# Patient Record
Sex: Female | Born: 1945 | Race: White | Hispanic: No | Marital: Married | Smoking: Never smoker
Health system: Southern US, Community
[De-identification: ages and names within clinical notes are randomized; demographics above are authoritative.]

## PROBLEM LIST (undated history)

## (undated) DIAGNOSIS — F419 Anxiety disorder, unspecified: Secondary | ICD-10-CM

## (undated) DIAGNOSIS — E039 Hypothyroidism, unspecified: Secondary | ICD-10-CM

## (undated) DIAGNOSIS — M8580 Other specified disorders of bone density and structure, unspecified site: Secondary | ICD-10-CM

## (undated) DIAGNOSIS — I10 Essential (primary) hypertension: Secondary | ICD-10-CM

## (undated) DIAGNOSIS — N189 Chronic kidney disease, unspecified: Secondary | ICD-10-CM

## (undated) HISTORY — PX: HAND SURGERY: SHX662

## (undated) HISTORY — DX: Anxiety disorder, unspecified: F41.9

## (undated) HISTORY — DX: Chronic kidney disease, unspecified: N18.9

## (undated) HISTORY — PX: CATARACT EXTRACTION: SUR2

## (undated) HISTORY — DX: Other specified disorders of bone density and structure, unspecified site: M85.80

## (undated) HISTORY — DX: Essential (primary) hypertension: I10

## (undated) HISTORY — PX: TONSILLECTOMY: SUR1361

---

## 1997-10-01 ENCOUNTER — Other Ambulatory Visit: Admission: RE | Admit: 1997-10-01 | Discharge: 1997-10-01 | Payer: Self-pay | Admitting: Obstetrics and Gynecology

## 1998-10-15 ENCOUNTER — Other Ambulatory Visit: Admission: RE | Admit: 1998-10-15 | Discharge: 1998-10-15 | Payer: Self-pay | Admitting: Obstetrics and Gynecology

## 1999-10-21 ENCOUNTER — Other Ambulatory Visit: Admission: RE | Admit: 1999-10-21 | Discharge: 1999-10-21 | Payer: Self-pay | Admitting: Obstetrics and Gynecology

## 2000-11-07 ENCOUNTER — Other Ambulatory Visit: Admission: RE | Admit: 2000-11-07 | Discharge: 2000-11-07 | Payer: Self-pay | Admitting: Obstetrics and Gynecology

## 2001-11-22 ENCOUNTER — Other Ambulatory Visit: Admission: RE | Admit: 2001-11-22 | Discharge: 2001-11-22 | Payer: Self-pay | Admitting: Obstetrics and Gynecology

## 2002-12-19 ENCOUNTER — Other Ambulatory Visit: Admission: RE | Admit: 2002-12-19 | Discharge: 2002-12-19 | Payer: Self-pay | Admitting: Obstetrics and Gynecology

## 2003-12-25 ENCOUNTER — Other Ambulatory Visit: Admission: RE | Admit: 2003-12-25 | Discharge: 2003-12-25 | Payer: Self-pay | Admitting: Obstetrics and Gynecology

## 2005-01-05 ENCOUNTER — Other Ambulatory Visit: Admission: RE | Admit: 2005-01-05 | Discharge: 2005-01-05 | Payer: Self-pay | Admitting: Obstetrics and Gynecology

## 2011-03-02 ENCOUNTER — Other Ambulatory Visit: Payer: Self-pay | Admitting: Obstetrics and Gynecology

## 2013-03-27 ENCOUNTER — Other Ambulatory Visit: Payer: Self-pay | Admitting: Obstetrics and Gynecology

## 2015-10-28 ENCOUNTER — Other Ambulatory Visit: Payer: Self-pay | Admitting: Obstetrics and Gynecology

## 2015-10-29 LAB — CYTOLOGY - PAP

## 2016-02-25 ENCOUNTER — Encounter: Payer: Self-pay | Admitting: Internal Medicine

## 2016-02-25 ENCOUNTER — Other Ambulatory Visit: Payer: Self-pay | Admitting: Internal Medicine

## 2016-02-25 DIAGNOSIS — R0602 Shortness of breath: Secondary | ICD-10-CM

## 2016-03-15 ENCOUNTER — Ambulatory Visit (INDEPENDENT_AMBULATORY_CARE_PROVIDER_SITE_OTHER): Payer: BC Managed Care – PPO

## 2016-03-15 DIAGNOSIS — R0602 Shortness of breath: Secondary | ICD-10-CM | POA: Diagnosis not present

## 2016-03-15 LAB — EXERCISE TOLERANCE TEST
CHL RATE OF PERCEIVED EXERTION: 15
CSEPEDS: 51 s
CSEPPHR: 150 {beats}/min
Estimated workload: 2.1 METS
Exercise duration (min): 1 min
MPHR: 148 {beats}/min
Percent HR: 101 %
Rest HR: 83 {beats}/min

## 2016-10-12 ENCOUNTER — Encounter: Payer: Self-pay | Admitting: Internal Medicine

## 2019-03-18 ENCOUNTER — Other Ambulatory Visit: Payer: Self-pay

## 2019-03-18 DIAGNOSIS — Z20822 Contact with and (suspected) exposure to covid-19: Secondary | ICD-10-CM

## 2019-03-19 LAB — NOVEL CORONAVIRUS, NAA: SARS-CoV-2, NAA: NOT DETECTED

## 2020-10-16 ENCOUNTER — Emergency Department (HOSPITAL_COMMUNITY)
Admission: EM | Admit: 2020-10-16 | Discharge: 2020-10-16 | Disposition: A | Discharge status: Home / Self Care | Payer: BC Managed Care – PPO | Attending: Emergency Medicine | Admitting: Emergency Medicine

## 2020-10-16 ENCOUNTER — Other Ambulatory Visit: Payer: Self-pay

## 2020-10-16 ENCOUNTER — Encounter (HOSPITAL_COMMUNITY): Payer: Self-pay | Admitting: *Deleted

## 2020-10-16 ENCOUNTER — Emergency Department (HOSPITAL_COMMUNITY): Payer: BC Managed Care – PPO

## 2020-10-16 DIAGNOSIS — I129 Hypertensive chronic kidney disease with stage 1 through stage 4 chronic kidney disease, or unspecified chronic kidney disease: Secondary | ICD-10-CM | POA: Diagnosis not present

## 2020-10-16 DIAGNOSIS — Z79899 Other long term (current) drug therapy: Secondary | ICD-10-CM | POA: Insufficient documentation

## 2020-10-16 DIAGNOSIS — J189 Pneumonia, unspecified organism: Secondary | ICD-10-CM | POA: Insufficient documentation

## 2020-10-16 DIAGNOSIS — F17200 Nicotine dependence, unspecified, uncomplicated: Secondary | ICD-10-CM | POA: Diagnosis not present

## 2020-10-16 DIAGNOSIS — R42 Dizziness and giddiness: Secondary | ICD-10-CM | POA: Diagnosis present

## 2020-10-16 DIAGNOSIS — N189 Chronic kidney disease, unspecified: Secondary | ICD-10-CM | POA: Insufficient documentation

## 2020-10-16 DIAGNOSIS — R55 Syncope and collapse: Secondary | ICD-10-CM | POA: Diagnosis not present

## 2020-10-16 LAB — COMPREHENSIVE METABOLIC PANEL
ALT: 23 U/L (ref 0–44)
AST: 20 U/L (ref 15–41)
Albumin: 4.2 g/dL (ref 3.5–5.0)
Alkaline Phosphatase: 55 U/L (ref 38–126)
Anion gap: 11 (ref 5–15)
BUN: 15 mg/dL (ref 8–23)
CO2: 24 mmol/L (ref 22–32)
Calcium: 9.1 mg/dL (ref 8.9–10.3)
Chloride: 95 mmol/L — ABNORMAL LOW (ref 98–111)
Creatinine, Ser: 0.94 mg/dL (ref 0.44–1.00)
GFR, Estimated: >60 mL/min (ref >60)
Glucose, Bld: 122 mg/dL — ABNORMAL HIGH (ref 70–99)
Potassium: 4.5 mmol/L (ref 3.5–5.1)
Sodium: 130 mmol/L — ABNORMAL LOW (ref 135–145)
Total Bilirubin: 0.2 mg/dL — ABNORMAL LOW (ref 0.3–1.2)
Total Protein: 7.2 g/dL (ref 6.5–8.1)

## 2020-10-16 LAB — URINALYSIS, ROUTINE W REFLEX MICROSCOPIC
Bilirubin Urine: NEGATIVE
Glucose, UA: NEGATIVE mg/dL
Hgb urine dipstick: NEGATIVE
Ketones, ur: 5 mg/dL — AB
Nitrite: NEGATIVE
Protein, ur: NEGATIVE mg/dL
Specific Gravity, Urine: 1.006 (ref 1.005–1.030)
pH: 6 (ref 5.0–8.0)

## 2020-10-16 LAB — CBC WITH DIFFERENTIAL/PLATELET
Abs Immature Granulocytes: 0.16 10*3/uL — ABNORMAL HIGH (ref 0.00–0.07)
Basophils Absolute: 0.1 10*3/uL (ref 0.0–0.1)
Basophils Relative: 0 %
Eosinophils Absolute: 0 10*3/uL (ref 0.0–0.5)
Eosinophils Relative: 0 %
HCT: 32.4 % — ABNORMAL LOW (ref 36.0–46.0)
Hemoglobin: 10.9 g/dL — ABNORMAL LOW (ref 12.0–15.0)
Immature Granulocytes: 1 %
Lymphocytes Relative: 17 %
Lymphs Abs: 2.4 10*3/uL (ref 0.7–4.0)
MCH: 31.8 pg (ref 26.0–34.0)
MCHC: 33.6 g/dL (ref 30.0–36.0)
MCV: 94.5 fL (ref 80.0–100.0)
Monocytes Absolute: 1.4 10*3/uL — ABNORMAL HIGH (ref 0.1–1.0)
Monocytes Relative: 10 %
Neutro Abs: 10.3 10*3/uL — ABNORMAL HIGH (ref 1.7–7.7)
Neutrophils Relative %: 72 %
Platelets: 253 10*3/uL (ref 150–400)
RBC: 3.43 MIL/uL — ABNORMAL LOW (ref 3.87–5.11)
RDW: 12.1 % (ref 11.5–15.5)
WBC: 14.4 10*3/uL — ABNORMAL HIGH (ref 4.0–10.5)
nRBC: 0 % (ref 0.0–0.2)

## 2020-10-16 LAB — LIPASE, BLOOD: Lipase: 29 U/L (ref 11–51)

## 2020-10-16 MED ORDER — SODIUM CHLORIDE 0.9 % IV BOLUS
500.0000 mL | Freq: Once | INTRAVENOUS | Status: AC
  Administered 2020-10-16: 500 mL via INTRAVENOUS

## 2020-10-16 MED ORDER — OXYCODONE HCL 5 MG PO TABS: 10.0000 mg | ORAL_TABLET | Freq: Once | ORAL | Status: DC

## 2020-10-16 MED ORDER — DOXYCYCLINE HYCLATE 100 MG PO CAPS: 100.0000 mg | ORAL_CAPSULE | Freq: Two times a day (BID) | ORAL | 0 refills | Status: AC

## 2020-10-16 NOTE — ED Triage Notes (Signed)
Pt states she lost a large amount of blood d/t a small wound on her left lower leg 2 days ago. They called 911, given some IV fluid and had normal EKG but refused transport to hospital. She has felt weak and dizzy. No loss of consciousness. She is not on blood thinners.

## 2020-10-16 NOTE — Discharge Instructions (Signed)
Follow-up with your primary doctor.  Ideally you should be seen sometime next week.  Recommend having your blood counts and electrolytes rechecked.  Recommend starting the antibiotic.  If you develop chest pain, difficulty breathing, fever, abdominal pain or other new concerning symptom, return to ER for reassessment.

## 2020-10-16 NOTE — ED Provider Notes (Signed)
[facility]-EMERGENCY DEPT Provider Note   CSN: [id number] Arrival date & time: 10/16/20  1208     History Chief Complaint  Patient presents with  . Dizziness  . Fatigue    Whitney Rogers is a 75 y.o. female.  Presents to ER with chief concern for possible blood loss.  She states that she had a small wound on her left lower leg a couple days ago that had busted open and had significant blood loss while in the shower.  Initially contacted EMS but refused transport at the time.  States that she has had a couple episodes of feeling lightheaded, generally occurring when attempting to stand, improved with rest.  Has felt generally weak.  She denies any chest pain or difficulty of breathing.  Has had a cough over the past couple weeks, nonproductive, had been attributed to allergies.  Has a primary care doctor.  HPI     Past Medical History:  Diagnosis Date  . Anxiety   . CRF (chronic renal failure)   . Hypertension   . Osteopenia     There are no problems to display for this patient.   History reviewed. No pertinent surgical history.   OB History   No obstetric history on file.     Family History  Problem Relation Age of Onset  . Colon cancer Mother   . Hypertension Father   . Hyperlipidemia Father     Social History   Tobacco Use  . Smoking status: Current Every Day Smoker  . Smokeless tobacco: Never Used  Substance Use Topics  . Alcohol use: No  . Drug use: No    Home Medications Prior to Admission medications   Medication Sig Start Date End Date Taking? Authorizing Provider  doxycycline (VIBRAMYCIN) 100 MG capsule Take 1 capsule (100 mg total) by mouth 2 (two) times daily for 7 days. 10/16/20 10/23/20 Yes Milagros Loll, MD  acetaminophen (TYLENOL) 325 MG tablet Take 650 mg by mouth every 12 (twelve) hours as needed for moderate pain.    [provider]  ALPRAZolam Prudy Feeler) 0.5 MG tablet Take 0.5 mg by mouth at bedtime as needed  for anxiety.    [provider]  Calcium 600-200 MG-UNIT tablet Take 1 tablet by mouth daily.    [provider]  celecoxib (CELEBREX) 200 MG capsule Take 200 mg by mouth 2 (two) times daily.    [provider]  cholecalciferol (VITAMIN D) 1000 units tablet Take 1,000 Units by mouth daily.    [provider]  losartan-hydrochlorothiazide (HYZAAR) 50-12.5 MG tablet Take 1 tablet by mouth daily.    [provider]  Multiple Vitamin (MULTIVITAMIN) tablet Take 1 tablet by mouth daily.    [provider]  norethindrone-ethinyl estradiol (FEMHRT LOW DOSE) 0.5-2.5 MG-MCG tablet Take 1 tablet by mouth daily.    [provider]  traMADol (ULTRAM) 50 MG tablet Take 50 mg by mouth every 6 (six) hours as needed for moderate pain.    [provider]    Allergies    Lisinopril  Review of Systems   Review of Systems  Constitutional: Positive for fatigue. Negative for chills and fever.  HENT: Negative for ear pain and sore throat.   Eyes: Negative for pain and visual disturbance.  Respiratory: Negative for cough and shortness of breath.   Cardiovascular: Negative for chest pain and palpitations.  Gastrointestinal: Negative for abdominal pain and vomiting.  Genitourinary: Negative for dysuria and hematuria.  Musculoskeletal:  Negative for arthralgias and back pain.  Skin: Negative for color change and rash.  Neurological: Positive for light-headedness. Negative for seizures and syncope.  All other systems reviewed and are negative.   Physical Exam Updated Vital Signs BP (!) 140/56   Pulse 83   Temp 98.5 F (36.9 C) (Oral)   Resp (!) 23   SpO2 98%   Physical Exam Vitals and nursing note reviewed.  Constitutional:      General: She is not in acute distress.    Appearance: She is well-developed.  HENT:     Head: Normocephalic and atraumatic.  Eyes:     Conjunctiva/sclera: Conjunctivae normal.  Cardiovascular:     Rate  and Rhythm: Normal rate and regular rhythm.     Heart sounds: No murmur heard.   Pulmonary:     Effort: Pulmonary effort is normal. No respiratory distress.     Breath sounds: Normal breath sounds.  Abdominal:     Palpations: Abdomen is soft.     Tenderness: There is no abdominal tenderness.  Musculoskeletal:     Cervical back: Neck supple.     Comments: LLE:  Small <1cm healing wound over the lower leg, no active bleeding  Skin:    General: Skin is warm and dry.  Neurological:     General: No focal deficit present.     Mental Status: She is alert and oriented to person, place, and time.  Psychiatric:        Mood and Affect: Mood normal.        Behavior: Behavior normal.     ED Results / Procedures / Treatments   Labs (all labs ordered are listed, but only abnormal results are displayed) Labs Reviewed  CBC WITH DIFFERENTIAL/PLATELET - Abnormal; Notable for the following components:      Result Value   WBC 14.4 (*)    RBC 3.43 (*)    Hemoglobin 10.9 (*)    HCT 32.4 (*)    Neutro Abs 10.3 (*)    Monocytes Absolute 1.4 (*)    Abs Immature Granulocytes 0.16 (*)    All other components within normal limits  COMPREHENSIVE METABOLIC PANEL - Abnormal; Notable for the following components:   Sodium 130 (*)    Chloride 95 (*)    Glucose, Bld 122 (*)    Total Bilirubin 0.2 (*)    All other components within normal limits  URINALYSIS, ROUTINE W REFLEX MICROSCOPIC - Abnormal; Notable for the following components:   Color, Urine STRAW (*)    Ketones, ur 5 (*)    Leukocytes,Ua LARGE (*)    Bacteria, UA RARE (*)    All other components within normal limits  LIPASE, BLOOD    EKG EKG Interpretation  Date/Time:  Friday October 16 2020 13:47:06 EDT Ventricular Rate:  85 PR Interval:  158 QRS Duration: 70 QT Interval:  360 QTC Calculation: 428 R Axis:   59 Text Interpretation:  Poor data quality, interpretation may be adversely affected Normal sinus rhythm Normal ECG Confirmed  by Marianna Fuss ([id number]) on 10/16/2020 2:03:14 PM   Radiology DG Chest Portable 1 View  Result Date: 10/16/2020 CLINICAL DATA:  Near syncope. EXAM: PORTABLE CHEST 1 VIEW COMPARISON:  None. FINDINGS: The heart size and mediastinal contours are within normal limits. Left greater than right basilar opacities. No visible pleural effusions or pneumothorax. IMPRESSION: Left greater than right basilar opacities, which could represent atelectasis, aspiration, and/or pneumonia. Electronically Signed   By: Feliberto Harts MD  On: 10/16/2020 13:55    Procedures Procedures   Medications Ordered in ED Medications  sodium chloride 0.9 % bolus 500 mL (0 mLs Intravenous Stopped 10/16/20 1548)    ED Course  I have reviewed the triage vital signs and the nursing notes.  Pertinent labs & imaging results that were available during my care of the patient were reviewed by me and considered in my medical decision making (see chart for details).    MDM Rules/Calculators/A&P                         75 year old lady presented to ER with concern for feeling generally weak and lightheaded as well as concern for possible blood loss from a wound from a couple days ago.  Regarding the wound on her extremity, there is no active bleeding at present, nothing that requires suture repair today.  Her hemoglobin was 10.9.  Patient also reports feeling generally weak, having intermittent lightheadedness.  Suspect vasovagal versus orthostasis.  She was provided some fluids.  Her electrolytes were stable.  Her chest x-ray had findings concerning for atelectasis versus pneumonia.  Patient on further history did endorse having a cough over the past couple weeks.  Will recommend trial of antibiotics.  She looks well and has stable vitals, believe ok for dc. Recommend she have close follow-up with primary doctor, recheck of labs next week.   After the discussed management above, the patient was determined to be safe for  discharge.  The patient was in agreement with this plan and all questions regarding their care were answered.  ED return precautions were discussed and the patient will return to the ED with any significant worsening of condition.   Final Clinical Impression(s) / ED Diagnoses Final diagnoses:  Near syncope  Pneumonia due to infectious organism, unspecified laterality, unspecified part of lung    Rx / DC Orders ED Discharge Orders         Ordered    doxycycline (VIBRAMYCIN) 100 MG capsule  2 times daily        10/16/20 1538           Milagros Loll, MD 10/17/20 [phone]

## 2021-03-29 ENCOUNTER — Other Ambulatory Visit (HOSPITAL_BASED_OUTPATIENT_CLINIC_OR_DEPARTMENT_OTHER): Payer: Self-pay

## 2021-03-29 ENCOUNTER — Ambulatory Visit: Payer: BC Managed Care – PPO | Attending: Internal Medicine

## 2021-03-29 DIAGNOSIS — Z23 Encounter for immunization: Secondary | ICD-10-CM

## 2021-03-29 MED ORDER — MODERNA COVID-19 BIVAL BOOSTER 50 MCG/0.5ML IM SUSP
INTRAMUSCULAR | 0 refills | Status: AC
  Filled 2021-03-29: qty 0.5, 1d supply, fill #0

## 2021-03-29 NOTE — Progress Notes (Signed)
   Covid-19 Vaccination Clinic  Name:  Whitney Rogers    MRN: [id number] DOB: 1945/09/04  03/29/2021  [name] was observed post Covid-19 immunization for 15 minutes without incident. She was provided with Vaccine Information Sheet and instruction to access the V-Safe system.   [name] was instructed to call 911 with any severe reactions post vaccine: Difficulty breathing  Swelling of face and throat  A fast heartbeat  A bad rash all over body  Dizziness and weakness

## 2021-04-14 ENCOUNTER — Other Ambulatory Visit: Payer: Self-pay | Admitting: Orthopaedic Surgery

## 2021-04-14 DIAGNOSIS — Z01818 Encounter for other preprocedural examination: Secondary | ICD-10-CM

## 2021-05-24 NOTE — Patient Instructions (Addendum)
DUE TO COVID-19 ONLY ONE VISITOR IS ALLOWED TO COME WITH YOU AND STAY IN THE WAITING ROOM ONLY DURING PRE OP AND PROCEDURE.   **NO VISITORS ARE ALLOWED IN THE SHORT STAY AREA OR RECOVERY ROOM!!**       Your procedure is scheduled on: 06/01/21   Report to [facility] Main Entrance    Report to admitting at 5:15 AM   Call this number if you have problems the morning of surgery [phone]   Do not eat food :After Midnight.   May have liquids until 4:30 AM day of surgery  CLEAR LIQUID DIET  Foods Allowed                                                                     Foods Excluded  Water, Black Coffee and tea (no milk or creamer)            liquids that you cannot  Plain Jell-O in any flavor  (No red)                                     see through such as: Fruit ices (not with fruit pulp)                                            milk, soups, orange juice              Iced Popsicles (No red)                                                All solid food                                   Apple juices Sports drinks like Gatorade (No red) Lightly seasoned clear broth or consume(fat free) Sugar    The day of surgery:  Drink ONE (1) Pre-Surgery Clear Ensure by 4:30 am the morning of surgery. Drink in one sitting. Do not sip.  This drink was given to you during your hospital  pre-op appointment visit. Nothing else to drink after completing the  Pre-Surgery Clear Ensure.          If you have questions, please contact your surgeon's office.     Oral Hygiene is also important to reduce your risk of infection.                                    Remember - BRUSH YOUR TEETH THE MORNING OF SURGERY WITH YOUR REGULAR TOOTHPASTE   Take these medicines the morning of surgery with A SIP OF WATER: Synthroid  You may not have any metal on your body including hair pins, jewelry, and body piercing             Do not wear make-up, lotions, powders,  perfumes, or deodorant  Do not wear nail polish including gel and S&S, artificial/acrylic nails, or any other type of covering on natural nails including finger and toenails. If you have artificial nails, gel coating, etc. that needs to be removed by a nail salon please have this removed prior to surgery or surgery may need to be canceled/ delayed if the surgeon/ anesthesia feels like they are unable to be safely monitored.   Do not shave  48 hours prior to surgery.    Do not bring valuables to the hospital. [facility] IS NOT             RESPONSIBLE   FOR VALUABLES.    Patients discharged on the day of surgery will not be allowed to drive home.   Special Instructions: Bring a copy of your healthcare power of attorney and living will documents         the day of surgery if you haven't scanned them before.              Please read over the following fact sheets you were given: IF YOU HAVE QUESTIONS ABOUT YOUR PRE-OP INSTRUCTIONS PLEASE CALL [phone]- [facility] - Preparing for Surgery Before surgery, you can play an important role.  Because skin is not sterile, your skin needs to be as free of germs as possible.  You can reduce the number of germs on your skin by washing with CHG (chlorahexidine gluconate) soap before surgery.  CHG is an antiseptic cleaner which kills germs and bonds with the skin to continue killing germs even after washing. Please DO NOT use if you have an allergy to CHG or antibacterial soaps.  If your skin becomes reddened/irritated stop using the CHG and inform your nurse when you arrive at Short Stay. Do not shave (including legs and underarms) for at least 48 hours prior to the first CHG shower.  You may shave your face/neck.  Please follow these instructions carefully:  1.  Shower with CHG Soap the night before surgery and the  morning of surgery.  2.  If you choose to wash your hair, wash your hair first as usual with your normal  shampoo.  3.  After you  shampoo, rinse your hair and body thoroughly to remove the shampoo.                             4.  Use CHG as you would any other liquid soap.  You can apply chg directly to the skin and wash.  Gently with a scrungie or clean washcloth.  5.  Apply the CHG Soap to your body ONLY FROM THE NECK DOWN.   Do   not use on face/ open                           Wound or open sores. Avoid contact with eyes, ears mouth and   genitals (private parts).                       Wash face,  Genitals (private parts) with your normal soap.             6.  Wash thoroughly, paying special attention to the area where your    surgery  will be performed.  7.  Thoroughly rinse your body with warm water from the neck down.  8.  DO NOT shower/wash with your normal soap after using and rinsing off the CHG Soap.                9.  Pat yourself dry with a clean towel.            10.  Wear clean pajamas.            11.  Place clean sheets on your bed the night of your first shower and do not  sleep with pets. Day of Surgery : Do not apply any lotions/deodorants the morning of surgery.  Please wear clean clothes to the hospital/surgery center.  FAILURE TO FOLLOW THESE INSTRUCTIONS MAY RESULT IN THE CANCELLATION OF YOUR SURGERY  PATIENT SIGNATURE_________________________________  NURSE SIGNATURE__________________________________  ________________________________________________________________________   Rogelia Mire  An incentive spirometer is a tool that can help keep your lungs clear and active. This tool measures how well you are filling your lungs with each breath. Taking long deep breaths may help reverse or decrease the chance of developing breathing (pulmonary) problems (especially infection) following: A long period of time when you are unable to move or be active. BEFORE THE PROCEDURE  If the spirometer includes an indicator to show your best effort, your nurse or respiratory therapist will set it to a  desired goal. If possible, sit up straight or lean slightly forward. Try not to slouch. Hold the incentive spirometer in an upright position. INSTRUCTIONS FOR USE  Sit on the edge of your bed if possible, or sit up as far as you can in bed or on a chair. Hold the incentive spirometer in an upright position. Breathe out normally. Place the mouthpiece in your mouth and seal your lips tightly around it. Breathe in slowly and as deeply as possible, raising the piston or the ball toward the top of the column. Hold your breath for 3-5 seconds or for as long as possible. Allow the piston or ball to fall to the bottom of the column. Remove the mouthpiece from your mouth and breathe out normally. Rest for a few seconds and repeat Steps 1 through 7 at least 10 times every 1-2 hours when you are awake. Take your time and take a few normal breaths between deep breaths. The spirometer may include an indicator to show your best effort. Use the indicator as a goal to work toward during each repetition. After each set of 10 deep breaths, practice coughing to be sure your lungs are clear. If you have an incision (the cut made at the time of surgery), support your incision when coughing by placing a pillow or rolled up towels firmly against it. Once you are able to get out of bed, walk around indoors and cough well. You may stop using the incentive spirometer when instructed by your caregiver.  RISKS AND COMPLICATIONS Take your time so you do not get dizzy or light-headed. If you are in pain, you may need to take or ask for pain medication before doing incentive spirometry. It is harder to take a deep breath if you are having pain. AFTER USE Rest and breathe slowly and easily. It can be helpful to keep track of a log of your progress. Your caregiver can provide you with a simple table to help with this. If you are  using the spirometer at home, follow these instructions: SEEK MEDICAL CARE IF:  You are having  difficultly using the spirometer. You have trouble using the spirometer as often as instructed. Your pain medication is not giving enough relief while using the spirometer. You develop fever of 100.5 F (38.1 C) or higher. SEEK IMMEDIATE MEDICAL CARE IF:  You cough up bloody sputum that had not been present before. You develop fever of 102 F (38.9 C) or greater. You develop worsening pain at or near the incision site. MAKE SURE YOU:  Understand these instructions. Will watch your condition. Will get help right away if you are not doing well or get worse. Document Released: 10/17/2006 Document Revised: 08/29/2011 Document Reviewed: 12/18/2006 Exit[employer] Patient Information 2014 [employer], [place].   ________________________________________________________________________  WHAT IS A BLOOD TRANSFUSION? Blood Transfusion Information  A transfusion is the replacement of blood or some of its parts. Blood is made up of multiple cells which provide different functions. Red blood cells carry oxygen and are used for blood loss replacement. White blood cells fight against infection. Platelets control bleeding. Plasma helps clot blood. Other blood products are available for specialized needs, such as hemophilia or other clotting disorders. BEFORE THE TRANSFUSION  Who gives blood for transfusions?  Healthy volunteers who are fully evaluated to make sure their blood is safe. This is blood bank blood. Transfusion therapy is the safest it has ever been in the practice of medicine. Before blood is taken from a donor, a complete history is taken to make sure that person has no history of diseases nor engages in risky social behavior (examples are intravenous drug use or sexual activity with multiple partners). The donor's travel history is screened to minimize risk of transmitting infections, such as malaria. The donated blood is tested for signs of infectious diseases, such as HIV and hepatitis. The blood  is then tested to be sure it is compatible with you in order to minimize the chance of a transfusion reaction. If you or a relative donates blood, this is often done in anticipation of surgery and is not appropriate for emergency situations. It takes many days to process the donated blood. RISKS AND COMPLICATIONS Although transfusion therapy is very safe and saves many lives, the main dangers of transfusion include:  Getting an infectious disease. Developing a transfusion reaction. This is an allergic reaction to something in the blood you were given. Every precaution is taken to prevent this. The decision to have a blood transfusion has been considered carefully by your caregiver before blood is given. Blood is not given unless the benefits outweigh the risks. AFTER THE TRANSFUSION Right after receiving a blood transfusion, you will usually feel much better and more energetic. This is especially true if your red blood cells have gotten low (anemic). The transfusion raises the level of the red blood cells which carry oxygen, and this usually causes an energy increase. The nurse administering the transfusion will monitor you carefully for complications. HOME CARE INSTRUCTIONS  No special instructions are needed after a transfusion. You may find your energy is better. Speak with your caregiver about any limitations on activity for underlying diseases you may have. SEEK MEDICAL CARE IF:  Your condition is not improving after your transfusion. You develop redness or irritation at the intravenous (IV) site. SEEK IMMEDIATE MEDICAL CARE IF:  Any of the following symptoms occur over the next 12 hours: Shaking chills. You have a temperature by mouth above 102 F (38.9 C), not controlled by  medicine. Chest, back, or muscle pain. People around you feel you are not acting correctly or are confused. Shortness of breath or difficulty breathing. Dizziness and fainting. You get a rash or develop hives. You  have a decrease in urine output. Your urine turns a dark color or changes to pink, red, or brown. Any of the following symptoms occur over the next 10 days: You have a temperature by mouth above 102 F (38.9 C), not controlled by medicine. Shortness of breath. Weakness after normal activity. The white part of the eye turns yellow (jaundice). You have a decrease in the amount of urine or are urinating less often. Your urine turns a dark color or changes to pink, red, or brown. Document Released: 06/03/2000 Document Revised: 08/29/2011 Document Reviewed: 01/21/2008 [facility] Patient Information 2014 [place], [place].  _______________________________________________________________________

## 2021-05-24 NOTE — Progress Notes (Addendum)
COVID swab appointment: n/a  COVID Vaccine Completed: yes x5 Date COVID Vaccine completed: Has received booster: COVID vaccine manufacturer: [employer]   A1C, CBC w/diff, CMP, UA 05/21/21 on chart  Date of COVID positive in last 90 days: no  PCP - Merri Brunette, MD [facility] Cardiologist - n/a  Medical Clearance by Mar Daring 05/21/21 on chart  Chest x-ray - 10/16/20 Epic EKG - 05/21/21 on chart Stress Test - 03/15/16 Epic ECHO - n/a Cardiac Cath - n/a Pacemaker/ICD device last checked: n/a Spinal Cord Stimulator: n/a  Sleep Study - n/a CPAP -   Fasting Blood Sugar - n/a Checks Blood Sugar _____ times a day  Blood Thinner Instructions: n/a Aspirin Instructions: Last Dose:  Activity level: Can go up a flight of stairs and perform activities of daily living without stopping and without symptoms of chest pain or shortness of breath. No stairs due to knee    Anesthesia review:   Patient denies shortness of breath, fever, cough and chest pain at PAT appointment   Patient verbalized understanding of instructions that were given to them at the PAT appointment. Patient was also instructed that they will need to review over the PAT instructions again at home before surgery.

## 2021-05-25 ENCOUNTER — Encounter (HOSPITAL_COMMUNITY)
Admission: RE | Admit: 2021-05-25 | Discharge: 2021-05-25 | Disposition: A | Discharge status: Home / Self Care | Payer: BC Managed Care – PPO | Source: Ambulatory Visit | Attending: Orthopaedic Surgery | Admitting: Orthopaedic Surgery

## 2021-05-25 ENCOUNTER — Encounter (HOSPITAL_COMMUNITY): Payer: Self-pay

## 2021-05-25 VITALS — BP 147/75 | HR 100 | Temp 97.6°F | Resp 16 | Ht 63.0 in | Wt 164.4 lb

## 2021-05-25 DIAGNOSIS — Z01812 Encounter for preprocedural laboratory examination: Secondary | ICD-10-CM | POA: Insufficient documentation

## 2021-05-25 DIAGNOSIS — Z01818 Encounter for other preprocedural examination: Secondary | ICD-10-CM

## 2021-05-25 HISTORY — DX: Hypothyroidism, unspecified: E03.9

## 2021-05-25 LAB — SURGICAL PCR SCREEN
MRSA, PCR: NEGATIVE
Staphylococcus aureus: NEGATIVE

## 2021-05-31 NOTE — H&P (Signed)
TOTAL KNEE ADMISSION H&P  Patient is being admitted for right total knee arthroplasty.  Subjective:  Chief Complaint:right knee pain.  HPI: Whitney Rogers, 75 y.o. female, has a history of pain and functional disability in the right knee due to arthritis and has failed non-surgical conservative treatments for greater than 12 weeks to includeNSAID's and/or analgesics, corticosteriod injections, flexibility and strengthening excercises, use of assistive devices, weight reduction as appropriate, and activity modification.  Onset of symptoms was gradual, starting 5 years ago with gradually worsening course since that time. The patient noted no past surgery on the right knee(s).  Patient currently rates pain in the right knee(s) at 10 out of 10 with activity. Patient has night pain, worsening of pain with activity and weight bearing, pain that interferes with activities of daily living, crepitus, and joint swelling.  Patient has evidence of subchondral cysts, subchondral sclerosis, periarticular osteophytes, and joint space narrowing by imaging studies. There is no active infection.  There are no problems to display for this patient.  Past Medical History:  Diagnosis Date   Anxiety    CRF (chronic renal failure)    Hypertension    Hypothyroidism    Osteopenia     Past Surgical History:  Procedure Laterality Date   CATARACT EXTRACTION Bilateral    [place], August 2021   HAND SURGERY     TONSILLECTOMY      No current facility-administered medications for this encounter.   Current Outpatient Medications  Medication Sig Dispense Refill Last Dose   ALPRAZolam (XANAX) 0.5 MG tablet Take 0.75 mg by mouth at bedtime.      Ascorbic Acid (VITAMIN C PO) Take 1 tablet by mouth daily.      Calcium 600-200 MG-UNIT tablet Take 1 tablet by mouth daily.      celecoxib (CELEBREX) 200 MG capsule Take 200 mg by mouth 2 (two) times daily.      estradiol (CLIMARA - DOSED IN MG/24 HR) 0.05 mg/24hr patch Place  0.05 mg onto the skin once a week.      levothyroxine (SYNTHROID) 75 MCG tablet Take 75 mcg by mouth daily before breakfast.      Multiple Vitamin (MULTIVITAMIN) tablet Take 1 tablet by mouth daily.      progesterone (PROMETRIUM) 100 MG capsule Take 100 mg by mouth daily.      solifenacin (VESICARE) 5 MG tablet Take 5 mg by mouth daily.      traMADol (ULTRAM) 50 MG tablet Take 50 mg by mouth at bedtime.      COVID-19 mRNA bivalent vaccine, Moderna, (MODERNA COVID-19 BIVAL BOOSTER) 50 MCG/0.5ML injection Inject into the muscle. (Patient not taking: Reported on 05/21/2021) 0.5 mL 0 Not Taking   Allergies  Allergen Reactions   Lisinopril Cough    Social History   Tobacco Use   Smoking status: Never   Smokeless tobacco: Never  Substance Use Topics   Alcohol use: No    Family History  Problem Relation Age of Onset   Colon cancer Mother    Hypertension Father    Hyperlipidemia Father      Review of Systems  Musculoskeletal:  Positive for arthralgias.       Right knee  All other systems reviewed and are negative.  Objective:  Physical Exam Constitutional:      Appearance: Normal appearance. She is normal weight.  HENT:     Head: Normocephalic and atraumatic.     Nose: Nose normal.     Mouth/Throat:  Pharynx: Oropharynx is clear.  Eyes:     Extraocular Movements: Extraocular movements intact.  Cardiovascular:     Rate and Rhythm: Normal rate and regular rhythm.  Pulmonary:     Effort: Pulmonary effort is normal.  Abdominal:     Palpations: Abdomen is soft.  Musculoskeletal:     Cervical back: Normal range of motion.     Comments: Right knee does have a moderate valgus deformity.  Her motion is about 0-95.  Opposite knee goes about 0-100.  She has no effusion with lateral greater than medial joint line pain.  There is some crepitation.  Hip motion is good and straight leg raise is negative.  Sensation and motor function are intact in her feet with palpable pulses on both  sides.   Skin:    General: Skin is warm and dry.  Neurological:     General: No focal deficit present.     Mental Status: She is alert and oriented to person, place, and time.  Psychiatric:        Mood and Affect: Mood normal.        Behavior: Behavior normal.        Thought Content: Thought content normal.        Judgment: Judgment normal.    Vital signs in last 24 hours:    Labs:   Estimated body mass index is 29.12 kg/m as calculated from the following:   Height as of 05/25/21: 5\' 3"  (1.6 m).   Weight as of 05/25/21: 74.6 kg.   Imaging Review Plain radiographs demonstrate severe degenerative joint disease of the right knee(s). The overall alignment isneutral. The bone quality appears to be good for age and reported activity level.    Assessment/Plan:  End stage primary arthritis, right knee   The patient history, physical examination, clinical judgment of the provider and imaging studies are consistent with end stage degenerative joint disease of the right knee(s) and total knee arthroplasty is deemed medically necessary. The treatment options including medical management, injection therapy arthroscopy and arthroplasty were discussed at length. The risks and benefits of total knee arthroplasty were presented and reviewed. The risks due to aseptic loosening, infection, stiffness, patella tracking problems, thromboembolic complications and other imponderables were discussed. The patient acknowledged the explanation, agreed to proceed with the plan and consent was signed. Patient is being admitted for inpatient treatment for surgery, pain control, PT, OT, prophylactic antibiotics, VTE prophylaxis, progressive ambulation and ADL's and discharge planning. The patient is planning to be discharged home with home health services   Patient's anticipated LOS is less than 2 midnights, meeting these requirements: - Younger than 83 - Lives within 1 hour of care - Has a competent adult at  home to recover with post-op recover - NO history of  - Chronic pain requiring opiods  - Diabetes  - Coronary Artery Disease  - Heart failure  - Heart attack  - Stroke  - DVT/VTE  - Cardiac arrhythmia  - Respiratory Failure/COPD  - Renal failure  - Anemia  - Advanced Liver disease

## 2021-06-01 ENCOUNTER — Other Ambulatory Visit: Payer: Self-pay

## 2021-06-01 ENCOUNTER — Encounter (HOSPITAL_COMMUNITY)
Admission: RE | Disposition: A | Discharge status: Home / Self Care | Payer: Self-pay | Source: Ambulatory Visit | Attending: Orthopaedic Surgery

## 2021-06-01 ENCOUNTER — Ambulatory Visit (HOSPITAL_COMMUNITY): Payer: BC Managed Care – PPO | Admitting: Anesthesiology

## 2021-06-01 ENCOUNTER — Observation Stay (HOSPITAL_COMMUNITY)
Admission: RE | Admit: 2021-06-01 | Discharge: 2021-06-03 | Disposition: A | Discharge status: Home / Self Care | Payer: BC Managed Care – PPO | Source: Ambulatory Visit | Attending: Orthopaedic Surgery | Admitting: Orthopaedic Surgery

## 2021-06-01 ENCOUNTER — Encounter (HOSPITAL_COMMUNITY): Payer: Self-pay | Admitting: Orthopaedic Surgery

## 2021-06-01 DIAGNOSIS — I10 Essential (primary) hypertension: Secondary | ICD-10-CM | POA: Insufficient documentation

## 2021-06-01 DIAGNOSIS — M1711 Unilateral primary osteoarthritis, right knee: Principal | ICD-10-CM | POA: Diagnosis present

## 2021-06-01 DIAGNOSIS — Z79899 Other long term (current) drug therapy: Secondary | ICD-10-CM | POA: Insufficient documentation

## 2021-06-01 DIAGNOSIS — Z01818 Encounter for other preprocedural examination: Secondary | ICD-10-CM

## 2021-06-01 DIAGNOSIS — E039 Hypothyroidism, unspecified: Secondary | ICD-10-CM | POA: Insufficient documentation

## 2021-06-01 DIAGNOSIS — Z96651 Presence of right artificial knee joint: Secondary | ICD-10-CM

## 2021-06-01 HISTORY — PX: TOTAL KNEE ARTHROPLASTY: SHX125

## 2021-06-01 LAB — TYPE AND SCREEN
ABO/RH(D): O POS
Antibody Screen: NEGATIVE

## 2021-06-01 LAB — ABO/RH: ABO/RH(D): O POS

## 2021-06-01 SURGERY — ARTHROPLASTY, KNEE, TOTAL
Anesthesia: Spinal | Site: Knee | Laterality: Right

## 2021-06-01 MED ORDER — ONDANSETRON HCL 4 MG/2ML IJ SOLN: 4.0000 mg | Freq: Four times a day (QID) | INTRAMUSCULAR | Status: DC | PRN

## 2021-06-01 MED ORDER — KETOROLAC TROMETHAMINE 15 MG/ML IJ SOLN
INTRAMUSCULAR | Status: AC
  Filled 2021-06-01: qty 1

## 2021-06-01 MED ORDER — METOCLOPRAMIDE HCL 5 MG PO TABS
5.0000 mg | ORAL_TABLET | Freq: Three times a day (TID) | ORAL | Status: DC | PRN
  Filled 2021-06-01: qty 2

## 2021-06-01 MED ORDER — SODIUM CHLORIDE 0.9 % IR SOLN
Status: DC | PRN
  Administered 2021-06-01: 3000 mL

## 2021-06-01 MED ORDER — ALPRAZOLAM 0.5 MG PO TABS
0.7500 mg | ORAL_TABLET | Freq: Every day | ORAL | Status: DC
  Administered 2021-06-01 – 2021-06-02 (×2): 0.75 mg via ORAL
  Filled 2021-06-01 (×2): qty 1

## 2021-06-01 MED ORDER — MIDAZOLAM HCL 5 MG/5ML IJ SOLN
INTRAMUSCULAR | Status: DC | PRN
  Administered 2021-06-01: 1.5 mg via INTRAVENOUS
  Administered 2021-06-01: .5 mg via INTRAVENOUS

## 2021-06-01 MED ORDER — ONDANSETRON HCL 4 MG/2ML IJ SOLN
4.0000 mg | Freq: Four times a day (QID) | INTRAMUSCULAR | Status: AC | PRN
  Administered 2021-06-01: 4 mg via INTRAVENOUS

## 2021-06-01 MED ORDER — LACTATED RINGERS IV BOLUS
250.0000 mL | Freq: Once | INTRAVENOUS | Status: AC
  Administered 2021-06-01: 250 mL via INTRAVENOUS

## 2021-06-01 MED ORDER — LIDOCAINE HCL (CARDIAC) PF 100 MG/5ML IV SOSY
PREFILLED_SYRINGE | INTRAVENOUS | Status: DC | PRN
Start: 2021-06-01 — End: 2021-06-01
  Administered 2021-06-01: 40 mg via INTRAVENOUS

## 2021-06-01 MED ORDER — BUPIVACAINE LIPOSOME 1.3 % IJ SUSP
INTRAMUSCULAR | Status: AC
  Filled 2021-06-01: qty 20

## 2021-06-01 MED ORDER — FENTANYL CITRATE (PF) 100 MCG/2ML IJ SOLN
INTRAMUSCULAR | Status: AC
  Filled 2021-06-01: qty 2

## 2021-06-01 MED ORDER — LACTATED RINGERS IV SOLN: INTRAVENOUS | Status: DC

## 2021-06-01 MED ORDER — BUPIVACAINE-EPINEPHRINE (PF) 0.25% -1:200000 IJ SOLN
INTRAMUSCULAR | Status: AC
  Filled 2021-06-01: qty 30

## 2021-06-01 MED ORDER — METHOCARBAMOL 500 MG IVPB - SIMPLE MED
500.0000 mg | Freq: Four times a day (QID) | INTRAVENOUS | Status: DC | PRN
  Administered 2021-06-01: 500 mg via INTRAVENOUS
  Filled 2021-06-01: qty 50

## 2021-06-01 MED ORDER — OXYCODONE HCL 5 MG PO TABS
ORAL_TABLET | ORAL | Status: AC
  Filled 2021-06-01: qty 1

## 2021-06-01 MED ORDER — TRANEXAMIC ACID-NACL 1000-0.7 MG/100ML-% IV SOLN: 1000.0000 mg | Freq: Once | INTRAVENOUS | Status: AC

## 2021-06-01 MED ORDER — PROGESTERONE MICRONIZED 100 MG PO CAPS
100.0000 mg | ORAL_CAPSULE | Freq: Every day | ORAL | Status: DC
  Administered 2021-06-01 – 2021-06-03 (×3): 100 mg via ORAL
  Filled 2021-06-01 (×3): qty 1

## 2021-06-01 MED ORDER — PHENOL 1.4 % MT LIQD: 1.0000 | OROMUCOSAL | Status: DC | PRN

## 2021-06-01 MED ORDER — TRANEXAMIC ACID 1000 MG/10ML IV SOLN
2000.0000 mg | INTRAVENOUS | Status: DC
  Filled 2021-06-01: qty 20

## 2021-06-01 MED ORDER — TRANEXAMIC ACID-NACL 1000-0.7 MG/100ML-% IV SOLN
1000.0000 mg | INTRAVENOUS | Status: AC
  Administered 2021-06-01: 1000 mg via INTRAVENOUS
  Filled 2021-06-01: qty 100

## 2021-06-01 MED ORDER — MORPHINE SULFATE (PF) 2 MG/ML IV SOLN: 0.5000 mg | INTRAVENOUS | Status: DC | PRN

## 2021-06-01 MED ORDER — ACETAMINOPHEN 500 MG PO TABS
500.0000 mg | ORAL_TABLET | Freq: Four times a day (QID) | ORAL | Status: AC
  Administered 2021-06-01: 500 mg via ORAL
  Filled 2021-06-01: qty 1

## 2021-06-01 MED ORDER — DARIFENACIN HYDROBROMIDE ER 7.5 MG PO TB24
7.5000 mg | ORAL_TABLET | Freq: Every day | ORAL | Status: DC
  Administered 2021-06-01 – 2021-06-03 (×3): 7.5 mg via ORAL
  Filled 2021-06-01 (×3): qty 1

## 2021-06-01 MED ORDER — LEVOTHYROXINE SODIUM 75 MCG PO TABS
75.0000 ug | ORAL_TABLET | Freq: Every day | ORAL | Status: DC
  Administered 2021-06-02 – 2021-06-03 (×2): 75 ug via ORAL
  Filled 2021-06-01 (×2): qty 1

## 2021-06-01 MED ORDER — FENTANYL CITRATE PF 50 MCG/ML IJ SOSY
25.0000 ug | PREFILLED_SYRINGE | INTRAMUSCULAR | Status: DC | PRN
  Administered 2021-06-01 (×2): 25 ug via INTRAVENOUS

## 2021-06-01 MED ORDER — ASPIRIN 81 MG PO CHEW
81.0000 mg | CHEWABLE_TABLET | Freq: Two times a day (BID) | ORAL | Status: DC
  Administered 2021-06-02 – 2021-06-03 (×3): 81 mg via ORAL
  Filled 2021-06-01 (×3): qty 1

## 2021-06-01 MED ORDER — METHOCARBAMOL 500 MG PO TABS
500.0000 mg | ORAL_TABLET | Freq: Four times a day (QID) | ORAL | Status: DC | PRN
  Administered 2021-06-01 – 2021-06-03 (×2): 500 mg via ORAL
  Filled 2021-06-01 (×2): qty 1

## 2021-06-01 MED ORDER — CEFAZOLIN SODIUM-DEXTROSE 2-4 GM/100ML-% IV SOLN
INTRAVENOUS | Status: AC
  Administered 2021-06-01: 2 g via INTRAVENOUS
  Filled 2021-06-01: qty 100

## 2021-06-01 MED ORDER — KETOROLAC TROMETHAMINE 15 MG/ML IJ SOLN
7.5000 mg | Freq: Four times a day (QID) | INTRAMUSCULAR | Status: AC
  Administered 2021-06-01 – 2021-06-02 (×3): 7.5 mg via INTRAVENOUS
  Filled 2021-06-01: qty 1

## 2021-06-01 MED ORDER — LACTATED RINGERS IV BOLUS
500.0000 mL | Freq: Once | INTRAVENOUS | Status: AC
  Administered 2021-06-01: 500 mL via INTRAVENOUS

## 2021-06-01 MED ORDER — LACTATED RINGERS IV BOLUS: 250.0000 mL | Freq: Once | INTRAVENOUS | Status: DC

## 2021-06-01 MED ORDER — OXYCODONE HCL 5 MG/5ML PO SOLN: 5.0000 mg | Freq: Once | ORAL | Status: AC | PRN

## 2021-06-01 MED ORDER — DEXAMETHASONE SODIUM PHOSPHATE 10 MG/ML IJ SOLN
INTRAMUSCULAR | Status: AC
  Filled 2021-06-01: qty 1

## 2021-06-01 MED ORDER — SODIUM CHLORIDE 0.9 % IV SOLN
INTRAVENOUS | Status: DC | PRN
  Administered 2021-06-01: 80 mL

## 2021-06-01 MED ORDER — METOCLOPRAMIDE HCL 5 MG/ML IJ SOLN: 5.0000 mg | Freq: Three times a day (TID) | INTRAMUSCULAR | Status: DC | PRN

## 2021-06-01 MED ORDER — PROPOFOL 500 MG/50ML IV EMUL
INTRAVENOUS | Status: DC | PRN
  Administered 2021-06-01 (×2): 10 mg via INTRAVENOUS

## 2021-06-01 MED ORDER — MENTHOL 3 MG MT LOZG: 1.0000 | LOZENGE | OROMUCOSAL | Status: DC | PRN

## 2021-06-01 MED ORDER — METHOCARBAMOL 500 MG IVPB - SIMPLE MED
INTRAVENOUS | Status: AC
  Filled 2021-06-01: qty 50

## 2021-06-01 MED ORDER — ONDANSETRON HCL 4 MG PO TABS
4.0000 mg | ORAL_TABLET | Freq: Four times a day (QID) | ORAL | Status: DC | PRN
  Filled 2021-06-01: qty 1

## 2021-06-01 MED ORDER — ONDANSETRON HCL 4 MG/2ML IJ SOLN
INTRAMUSCULAR | Status: AC
  Filled 2021-06-01: qty 2

## 2021-06-01 MED ORDER — CEFAZOLIN SODIUM-DEXTROSE 2-4 GM/100ML-% IV SOLN
2.0000 g | INTRAVENOUS | Status: AC
  Administered 2021-06-01: 2 g via INTRAVENOUS
  Filled 2021-06-01: qty 100

## 2021-06-01 MED ORDER — ACETAMINOPHEN 325 MG PO TABS
325.0000 mg | ORAL_TABLET | Freq: Four times a day (QID) | ORAL | Status: DC | PRN
Start: 2021-06-02 — End: 2021-06-04
  Administered 2021-06-02 – 2021-06-03 (×2): 650 mg via ORAL
  Filled 2021-06-01 (×2): qty 2

## 2021-06-01 MED ORDER — FENTANYL CITRATE (PF) 100 MCG/2ML IJ SOLN
INTRAMUSCULAR | Status: DC | PRN
  Administered 2021-06-01 (×2): 50 ug via INTRAVENOUS

## 2021-06-01 MED ORDER — OXYCODONE HCL 5 MG PO TABS
5.0000 mg | ORAL_TABLET | Freq: Once | ORAL | Status: AC | PRN
  Administered 2021-06-01: 5 mg via ORAL

## 2021-06-01 MED ORDER — DOCUSATE SODIUM 100 MG PO CAPS
100.0000 mg | ORAL_CAPSULE | Freq: Two times a day (BID) | ORAL | Status: DC
  Administered 2021-06-01 – 2021-06-03 (×4): 100 mg via ORAL
  Filled 2021-06-01 (×4): qty 1

## 2021-06-01 MED ORDER — DEXAMETHASONE SODIUM PHOSPHATE 10 MG/ML IJ SOLN
INTRAMUSCULAR | Status: DC | PRN
  Administered 2021-06-01: 10 mg via INTRAVENOUS

## 2021-06-01 MED ORDER — PROPOFOL 1000 MG/100ML IV EMUL
INTRAVENOUS | Status: AC
  Filled 2021-06-01: qty 100

## 2021-06-01 MED ORDER — HYDROCODONE-ACETAMINOPHEN 5-325 MG PO TABS
1.0000 | ORAL_TABLET | ORAL | Status: DC | PRN
  Administered 2021-06-02: 07:00:00 2 via ORAL
  Administered 2021-06-02: 19:00:00 1 via ORAL
  Administered 2021-06-02: 2 via ORAL
  Administered 2021-06-02: 15:00:00 1 via ORAL
  Administered 2021-06-03 (×4): 2 via ORAL
  Filled 2021-06-01 (×4): qty 2
  Filled 2021-06-01: qty 1
  Filled 2021-06-01 (×3): qty 2

## 2021-06-01 MED ORDER — PROPOFOL 500 MG/50ML IV EMUL
INTRAVENOUS | Status: DC | PRN
  Administered 2021-06-01: 50 ug/kg/min via INTRAVENOUS

## 2021-06-01 MED ORDER — ALUM & MAG HYDROXIDE-SIMETH 200-200-20 MG/5ML PO SUSP: 30.0000 mL | ORAL | Status: DC | PRN

## 2021-06-01 MED ORDER — BISACODYL 5 MG PO TBEC
5.0000 mg | DELAYED_RELEASE_TABLET | Freq: Every day | ORAL | Status: DC | PRN
  Administered 2021-06-03: 5 mg via ORAL
  Filled 2021-06-01: qty 1

## 2021-06-01 MED ORDER — BUPIVACAINE IN DEXTROSE 0.75-8.25 % IT SOLN
INTRATHECAL | Status: DC | PRN
  Administered 2021-06-01: 1.6 mL via INTRATHECAL

## 2021-06-01 MED ORDER — ORAL CARE MOUTH RINSE: 15.0000 mL | Freq: Once | OROMUCOSAL | Status: AC

## 2021-06-01 MED ORDER — HYDROCODONE-ACETAMINOPHEN 7.5-325 MG PO TABS: 1.0000 | ORAL_TABLET | ORAL | Status: DC | PRN

## 2021-06-01 MED ORDER — FENTANYL CITRATE PF 50 MCG/ML IJ SOSY
PREFILLED_SYRINGE | INTRAMUSCULAR | Status: AC
  Filled 2021-06-01: qty 1

## 2021-06-01 MED ORDER — TRANEXAMIC ACID 1000 MG/10ML IV SOLN
INTRAVENOUS | Status: DC | PRN
  Administered 2021-06-01: 2000 mg via TOPICAL

## 2021-06-01 MED ORDER — MIDAZOLAM HCL 2 MG/2ML IJ SOLN
INTRAMUSCULAR | Status: AC
  Filled 2021-06-01: qty 2

## 2021-06-01 MED ORDER — SODIUM CHLORIDE (PF) 0.9 % IJ SOLN
INTRAMUSCULAR | Status: AC
  Filled 2021-06-01: qty 30

## 2021-06-01 MED ORDER — SODIUM CHLORIDE FLUSH 0.9 % IV SOLN: INTRAVENOUS | Status: DC | PRN

## 2021-06-01 MED ORDER — TRANEXAMIC ACID-NACL 1000-0.7 MG/100ML-% IV SOLN
INTRAVENOUS | Status: AC
  Administered 2021-06-01: 1000 mg via INTRAVENOUS
  Filled 2021-06-01: qty 100

## 2021-06-01 MED ORDER — CHLORHEXIDINE GLUCONATE 0.12 % MT SOLN
15.0000 mL | Freq: Once | OROMUCOSAL | Status: AC
  Administered 2021-06-01: 15 mL via OROMUCOSAL

## 2021-06-01 MED ORDER — ROPIVACAINE HCL 7.5 MG/ML IJ SOLN
INTRAMUSCULAR | Status: DC | PRN
  Administered 2021-06-01: 20 mL via PERINEURAL

## 2021-06-01 MED ORDER — BUPIVACAINE LIPOSOME 1.3 % IJ SUSP: 20.0000 mL | Freq: Once | INTRAMUSCULAR | Status: DC

## 2021-06-01 MED ORDER — 0.9 % SODIUM CHLORIDE (POUR BTL) OPTIME
TOPICAL | Status: DC | PRN
  Administered 2021-06-01: 1000 mL

## 2021-06-01 MED ORDER — BUPIVACAINE-EPINEPHRINE 0.5% -1:200000 IJ SOLN
INTRAMUSCULAR | Status: AC
  Filled 2021-06-01: qty 1

## 2021-06-01 MED ORDER — POVIDONE-IODINE 10 % EX SWAB
2.0000 "application " | Freq: Once | CUTANEOUS | Status: AC
  Administered 2021-06-01: 2 via TOPICAL

## 2021-06-01 MED ORDER — CEFAZOLIN SODIUM-DEXTROSE 2-4 GM/100ML-% IV SOLN
2.0000 g | Freq: Four times a day (QID) | INTRAVENOUS | Status: AC
  Administered 2021-06-01: 2 g via INTRAVENOUS
  Filled 2021-06-01 (×2): qty 100

## 2021-06-01 MED ORDER — DIPHENHYDRAMINE HCL 12.5 MG/5ML PO ELIX: 12.5000 mg | ORAL_SOLUTION | ORAL | Status: DC | PRN

## 2021-06-01 SURGICAL SUPPLY — 55 items
ATTUNE MED DOME PAT 32 KNEE (Knees) ×1 IMPLANT
ATTUNE PSFEM RTSZ6 NARCEM KNEE (Femur) ×1 IMPLANT
ATTUNE PSRP INSR SZ6 5 KNEE (Insert) ×1 IMPLANT
BAG COUNTER SPONGE SURGICOUNT (BAG) ×2 IMPLANT
BAG DECANTER FOR FLEXI CONT (MISCELLANEOUS) ×2 IMPLANT
BAG SPEC THK2 15X12 ZIP CLS (MISCELLANEOUS) ×1
BAG ZIPLOCK 12X15 (MISCELLANEOUS) ×2 IMPLANT
BASEPLATE TIBIAL ROTATING SZ 4 (Knees) ×1 IMPLANT
BLADE SAGITTAL 25.0X1.19X90 (BLADE) ×2 IMPLANT
BLADE SAW SGTL 11.0X1.19X90.0M (BLADE) ×2 IMPLANT
BLADE SURG SZ10 CARB STEEL (BLADE) ×2 IMPLANT
BNDG ELASTIC 6X5.8 VLCR STR LF (GAUZE/BANDAGES/DRESSINGS) ×2 IMPLANT
BOOTIES KNEE HIGH SLOAN (MISCELLANEOUS) ×2 IMPLANT
BOWL SMART MIX CTS (DISPOSABLE) ×2 IMPLANT
BSPLAT TIB 4 CMNT ROT PLAT STR (Knees) ×1 IMPLANT
CEMENT HV SMART SET (Cement) ×4 IMPLANT
COVER SURGICAL LIGHT HANDLE (MISCELLANEOUS) ×2 IMPLANT
CUFF TOURN SGL QUICK 34 (TOURNIQUET CUFF) ×2
CUFF TRNQT CYL 34X4.125X (TOURNIQUET CUFF) ×1 IMPLANT
DECANTER SPIKE VIAL GLASS SM (MISCELLANEOUS) ×4 IMPLANT
DRAPE INCISE IOBAN 66X45 STRL (DRAPES) ×2 IMPLANT
DRAPE ORTHO 2.5IN SPLIT 77X108 (DRAPES) ×1 IMPLANT
DRAPE ORTHO SPLIT 77X108 STRL (DRAPES) ×2
DRAPE SHEET LG 3/4 BI-LAMINATE (DRAPES) ×2 IMPLANT
DRAPE U-SHAPE 47X51 STRL (DRAPES) ×2 IMPLANT
DRSG AQUACEL AG ADV 3.5X10 (GAUZE/BANDAGES/DRESSINGS) ×2 IMPLANT
DURAPREP 26ML APPLICATOR (WOUND CARE) ×4 IMPLANT
ELECT REM PT RETURN 15FT ADLT (MISCELLANEOUS) ×2 IMPLANT
GLOVE SRG 8 PF TXTR STRL LF DI (GLOVE) ×2 IMPLANT
GLOVE SURG ENC MOIS LTX SZ8 (GLOVE) ×4 IMPLANT
GLOVE SURG UNDER POLY LF SZ8 (GLOVE) ×4
GOWN STRL REUS W/TWL XL LVL3 (GOWN DISPOSABLE) ×4 IMPLANT
HANDPIECE INTERPULSE COAX TIP (DISPOSABLE) ×2
HOLDER FOLEY CATH W/STRAP (MISCELLANEOUS) ×1 IMPLANT
HOOD PEEL AWAY FLYTE STAYCOOL (MISCELLANEOUS) ×6 IMPLANT
KIT TURNOVER KIT A (KITS) IMPLANT
MANIFOLD NEPTUNE II (INSTRUMENTS) ×2 IMPLANT
NEEDLE HYPO 22GX1.5 SAFETY (NEEDLE) ×2 IMPLANT
NS IRRIG 1000ML POUR BTL (IV SOLUTION) ×2 IMPLANT
PACK TOTAL KNEE CUSTOM (KITS) ×2 IMPLANT
PAD ARMBOARD 7.5X6 YLW CONV (MISCELLANEOUS) ×2 IMPLANT
PROTECTOR NERVE ULNAR (MISCELLANEOUS) ×2 IMPLANT
SET HNDPC FAN SPRY TIP SCT (DISPOSABLE) ×1 IMPLANT
SPONGE T-LAP 18X18 ~~LOC~~+RFID (SPONGE) ×2 IMPLANT
SUT ETHIBOND NAB CT1 #1 30IN (SUTURE) ×4 IMPLANT
SUT VIC AB 0 CT1 36 (SUTURE) ×2 IMPLANT
SUT VIC AB 2-0 CT1 27 (SUTURE) ×2
SUT VIC AB 2-0 CT1 TAPERPNT 27 (SUTURE) ×1 IMPLANT
SUT VICRYL AB 3-0 FS1 BRD 27IN (SUTURE) ×2 IMPLANT
SUT VLOC 180 0 24IN GS25 (SUTURE) ×2 IMPLANT
TRAY FOLEY MTR SLVR 14FR STAT (SET/KITS/TRAYS/PACK) ×1 IMPLANT
TRAY FOLEY MTR SLVR 16FR STAT (SET/KITS/TRAYS/PACK) IMPLANT
WATER STERILE IRR 1000ML POUR (IV SOLUTION) ×2 IMPLANT
WRAP KNEE MAXI GEL POST OP (GAUZE/BANDAGES/DRESSINGS) ×2 IMPLANT
YANKAUER SUCT BULB TIP NO VENT (SUCTIONS) ×2 IMPLANT

## 2021-06-01 NOTE — Op Note (Addendum)
PREOP DIAGNOSIS: DJD RIGHT KNEE POSTOP DIAGNOSIS: same PROCEDURE: RIGHT TKR ANESTHESIA: Spinal and MAC ATTENDING SURGEON: Hessie Dibble ASSISTANT: Loni Dolly PA  INDICATIONS FOR PROCEDURE: Whitney Rogers is a 75 y.o. female who has struggled for a long time with pain due to degenerative arthritis of the right knee.  The patient has failed many conservative non-operative measures and at this point has pain which limits the ability to sleep and walk.  The patient is offered total knee replacement.  Informed operative consent was obtained after discussion of possible risks of anesthesia, infection, neurovascular injury, DVT, and death.  The importance of the post-operative rehabilitation protocol to optimize result was stressed extensively with the patient. She had a moderate valgus deformity and mild flexion contracture preop.  SUMMARY OF FINDINGS AND PROCEDURE:  Whitney Rogers was taken to the operative suite where under the above anesthesia a right knee replacement was performed.  There were advanced degenerative changes and the bone quality was poor.  We used the DePuy Attune system and placed size 6 narrow femur, 4 tibia, 32 mm all polyethylene patella, and a size 5 mm spacer.  Loni Dolly PA-C assisted throughout and was invaluable to the completion of the case in that he helped retract and maintain exposure while I placed components.  He also helped close thereby minimizing OR time.  The patient was admitted for appropriate post-op care to include perioperative antibiotics and mechanical and pharmacologic measures for DVT prophylaxis.  DESCRIPTION OF PROCEDURE:  Whitney Rogers was taken to the operative suite where the above anesthesia was applied.  The patient was positioned supine and prepped and draped in normal sterile fashion.  An appropriate time out was performed.  After the administration of kefzol pre-op antibiotic the leg was elevated and exsanguinated and a tourniquet inflated. A  standard longitudinal incision was made on the anterior knee.  Dissection was carried down to the extensor mechanism.  All appropriate anti-infective measures were used including the pre-operative antibiotic, betadine impregnated drape, and closed hooded exhaust systems for each member of the surgical team.  A medial parapatellar incision was made in the extensor mechanism and the knee cap flipped and the knee flexed.  Some residual meniscal tissues were removed along with any remaining ACL/PCL tissue.  A guide was placed on the tibia and a flat cut was made on it's superior surface.  An intramedullary guide was placed in the femur and was utilized to make anterior and posterior cuts creating an appropriate flexion gap.  A second intramedullary guide was placed in the femur to make a distal cut properly balancing the knee with an extension gap equal to the flexion gap.  The three bones sized to the above mentioned sizes and the appropriate guides were placed and utilized.  A trial reduction was done and the knee easily came to full extension and the patella tracked well on flexion.  The trial components were removed and all bones were cleaned with pulsatile lavage and then dried thoroughly.  Cement was mixed and was pressurized onto the bones followed by placement of the aforementioned components.  Excess cement was trimmed and pressure was held on the components until the cement had hardened.  The tourniquet was deflated and a small amount of bleeding was controlled with cautery and pressure.  The knee was irrigated thoroughly.  The extensor mechanism was re-approximated with #1 ethibond in interrupted fashion.  The knee was flexed and the repair was solid.  The subcutaneous tissues  were re-approximated with #0 and #2-0 vicryl and the skin closed with a subcuticular stitch and steristrips.  A sterile dressing was applied.  Intraoperative fluids, EBL, and tourniquet time can be obtained from anesthesia  records.  DISPOSITION:  The patient was taken to recovery room in stable condition and scheduled to potentially go home same day depending on ability to walk and tolerate liquids.Whitney Rogers 06/01/2021, 9:10 AM

## 2021-06-01 NOTE — Anesthesia Procedure Notes (Signed)
Anesthesia Regional Block: Adductor canal block   Pre-Anesthetic Checklist: , timeout performed,  Correct Patient, Correct Site, Correct Laterality,  Correct Procedure, Correct Position, site marked,  Risks and benefits discussed,  Surgical consent,  Pre-op evaluation,  At surgeon's request and post-op pain management  Laterality: Right  Prep: chloraprep       Needles:  Injection technique: Single-shot  Needle Type: Echogenic Needle     Needle Length: 9cm  Needle Gauge: 21     Additional Needles:   Narrative:  Start time: 06/01/2021 7:03 AM End time: 06/01/2021 7:14 AM Injection made incrementally with aspirations every 5 mL.  Performed by: Personally  Anesthesiologist: Achille Rich, MD  Additional Notes: Pt tolerated the procedure well.

## 2021-06-01 NOTE — Progress Notes (Signed)
Physical Therapy Treatment Patient Details Name: Whitney Rogers MRN: [id number] DOB: July 23, 1945 Today's Date: 06/01/2021   History of Present Illness 75 yo female s/p R TKA 06/01/21. Hx of renal failure, osteopenia    PT Comments    2nd session in PACU. Pt still has extensor lag with attempts at SLR. Able to stand but unable to take any steps 2* knee buckling. Informed pt that she is still unsafe and that she needs to remain in hospital to continue to work with therapy. Made RN aware.     Recommendations for follow up therapy are one component of a multi-disciplinary discharge planning process, led by the attending physician.  Recommendations may be updated based on patient status, additional functional criteria and insurance authorization.  Follow Up Recommendations  Home health PT     Assistance Recommended at Discharge Frequent or constant Supervision/Assistance  Equipment Recommendations  Rolling walker (2 wheels)    Recommendations for Other Services       Precautions / Restrictions Precautions Precautions: Fall;Knee Precaution Comments: R knee buckling Restrictions Weight Bearing Restrictions: No Other Position/Activity Restrictions: WBAT     Mobility  Bed Mobility Overal bed mobility: Needs Assistance Bed Mobility: Supine to Sit          General bed mobility comments: oob in recliner    Transfers Overall transfer level: Needs assistance Equipment used: Rolling walker (2 wheels) Transfers: Sit to/from Stand Sit to Stand: Min assist          General transfer comment: Min A to stand. Unable to take any steps 2* R knee still buckling. Cues for safety, technique, sequence.    Ambulation/Gait               General Gait Details: NT-uanble to safely attempt 2* R LE buckling   Stairs             Wheelchair Mobility    Modified Rankin (Stroke Patients Only)       Balance Overall balance assessment: Needs assistance          Standing balance support: Bilateral upper extremity supported;Reliant on assistive device for balance Standing balance-Leahy Scale: Poor                              Cognition Arousal/Alertness: Awake/alert Behavior During Therapy: WFL for tasks assessed/performed Overall Cognitive Status: Within Functional Limits for tasks assessed                                          Exercises Total Joint Exercises Ankle Circles/Pumps: PROM;Both;10 reps Quad Sets: AROM;Right;10 reps Heel Slides: AAROM;Right;10 reps;Supine Hip ABduction/ADduction: AAROM;Right;10 reps;Supine Straight Leg Raises: AAROM;Right;10 reps;Supine Goniometric ROM: ~10-65 degrees    General Comments        Pertinent Vitals/Pain Pain Assessment: 0-10 Pain Score: 4  Pain Location: R thigh Pain Descriptors / Indicators: Aching;Discomfort;Sore Pain Intervention(s): Limited activity within patient's tolerance;Monitored during session;Repositioned    Home Living Family/patient expects to be discharged to:: Private residence Living Arrangements: Spouse/significant other;Children   Type of Home: House Home Access: Stairs to enter   Secretary/administrator of Steps: 1   Home Layout: One level Home Equipment: None      Prior Function            PT Goals (current goals can now be found in the care  plan section) Acute Rehab PT Goals Patient Stated Goal: home! PT Goal Formulation: With patient Time For Goal Achievement: 06/15/21 Potential to Achieve Goals: Good Progress towards PT goals: Progressing toward goals    Frequency    7X/week      PT Plan Current plan remains appropriate    Co-evaluation              AM-PAC PT "6 Clicks" Mobility   Outcome Measure  Help needed turning from your back to your side while in a flat bed without using bedrails?: A Little Help needed moving from lying on your back to sitting on the side of a flat bed without using bedrails?:  A Little Help needed moving to and from a bed to a chair (including a wheelchair)?: A Lot Help needed standing up from a chair using your arms (e.g., wheelchair or bedside chair)?: A Little Help needed to walk in hospital room?: Total Help needed climbing 3-5 steps with a railing? : Total 6 Click Score: 13    End of Session Equipment Utilized During Treatment: Gait belt Activity Tolerance: Patient tolerated treatment well Patient left: in chair;with call bell/phone within reach   PT Visit Diagnosis: Muscle weakness (generalized) (M62.81);Difficulty in walking, not elsewhere classified (R26.2)     Time: 0867-6195 PT Time Calculation (min) (ACUTE ONLY): 20 min  Charges:  $Therapeutic Activity: 8-22 mins                         Faye Ramsay, PT Acute Rehabilitation  Office: [phone] Pager: [phone]

## 2021-06-01 NOTE — Anesthesia Preprocedure Evaluation (Signed)
Anesthesia Evaluation  Patient identified by MRN, date of birth, ID band Patient awake    Reviewed: Allergy & Precautions, H&P , NPO status , Patient's Chart, lab work & pertinent test results  Airway Mallampati: II   Neck ROM: full    Dental   Pulmonary neg pulmonary ROS,    breath sounds clear to auscultation       Cardiovascular hypertension,  Rhythm:regular Rate:Normal     Neuro/Psych PSYCHIATRIC DISORDERS Anxiety    GI/Hepatic   Endo/Other  Hypothyroidism   Renal/GU Renal disease     Musculoskeletal   Abdominal   Peds  Hematology   Anesthesia Other Findings   Reproductive/Obstetrics                             Anesthesia Physical Anesthesia Plan  ASA: 2  Anesthesia Plan: Spinal   Post-op Pain Management: Regional block   Induction: Intravenous  PONV Risk Score and Plan: 2 and Ondansetron, Propofol infusion and Treatment may vary due to age or medical condition  Airway Management Planned: Simple Face Mask  Additional Equipment:   Intra-op Plan:   Post-operative Plan:   Informed Consent: I have reviewed the patients History and Physical, chart, labs and discussed the procedure including the risks, benefits and alternatives for the proposed anesthesia with the patient or authorized representative who has indicated his/her understanding and acceptance.     Dental advisory given  Plan Discussed with: CRNA, Anesthesiologist and Surgeon  Anesthesia Plan Comments:         Anesthesia Quick Evaluation

## 2021-06-01 NOTE — Interval H&P Note (Signed)
History and Physical Interval Note:  06/01/2021 7:19 AM  Whitney Rogers  has presented today for surgery, with the diagnosis of RIGHT KNEE DEGENERATIVE JOINT DISEASE.  The various methods of treatment have been discussed with the patient and family. After consideration of risks, benefits and other options for treatment, the patient has consented to  Procedure(s): RIGHT TOTAL KNEE ARTHROPLASTY (Right) as a surgical intervention.  The patient's history has been reviewed, patient examined, no change in status, stable for surgery.  I have reviewed the patient's chart and labs.  Questions were answered to the patient's satisfaction.     Velna Ochs

## 2021-06-01 NOTE — Transfer of Care (Signed)
Immediate Anesthesia Transfer of Care Note  Patient: Whitney Rogers  Procedure(s) Performed: RIGHT TOTAL KNEE ARTHROPLASTY (Right: Knee)  Patient Location: PACU  Anesthesia Type:Spinal  Level of Consciousness: awake, alert , oriented and patient cooperative  Airway & Oxygen Therapy: Patient Spontanous Breathing and Patient connected to face mask oxygen  Post-op Assessment: Report given to RN and Post -op Vital signs reviewed and stable  Post vital signs: Reviewed and stable  Last Vitals:  Vitals Value Taken Time  BP 133/68 06/01/21 0935  Temp    Pulse 75 06/01/21 0936  Resp 8 06/01/21 0936  SpO2 100 % 06/01/21 0936  Vitals shown include unvalidated device data.  Last Pain:  Vitals:   06/01/21 0631  TempSrc:   PainSc: 0-No pain         Complications: No notable events documented.

## 2021-06-01 NOTE — Anesthesia Procedure Notes (Signed)
Spinal  Patient location during procedure: OR Start time: 06/01/2021 7:32 AM End time: 06/01/2021 7:36 AM Reason for block: surgical anesthesia Staffing Performed: anesthesiologist  Anesthesiologist: Achille Rich, MD Preanesthetic Checklist Completed: patient identified, IV checked, risks and benefits discussed, surgical consent, monitors and equipment checked, pre-op evaluation and timeout performed Spinal Block Patient position: sitting Prep: DuraPrep Patient monitoring: cardiac monitor, continuous pulse ox and blood pressure Approach: midline Location: L2-3 Injection technique: single-shot Needle Needle type: Pencan  Needle gauge: 24 G Needle length: 9 cm Assessment Sensory level: T10 Events: CSF return Additional Notes Functioning IV was confirmed and monitors were applied. Sterile prep and drape, including hand hygiene and sterile gloves were used. The patient was positioned and the spine was prepped. The skin was anesthetized with lidocaine.  Free flow of clear CSF was obtained prior to injecting local anesthetic into the CSF.  The spinal needle aspirated freely following injection.  The needle was carefully withdrawn.  The patient tolerated the procedure well.

## 2021-06-01 NOTE — Evaluation (Signed)
Physical Therapy Evaluation Patient Details Name: Whitney Rogers MRN: [id number] DOB: 1945-10-11 Today's Date: 06/01/2021  History of Present Illness  75 yo female s/p R TKA 06/01/21. Hx of renal failure, osteopenia  Clinical Impression  On eval in PACU POD 0, pt required Mod A to stand and Min A to perform a squat pivot to bsc/recliner. Sensation remains impaired-pt unable to perform SLR without significant extensor lag and unable to WB on R LE. Pt also had episode of bladder incontinence-total assist for hygiene. Informed pt that she is not safe to d/c home on today. Pt was very upset by this news. She has poor insight regarding current mobility status and fall risk status. She has many concerns (getting cell phone, medicines from home, meal, getting a room with a tv). Recommended to pt that she discuss these concerns with RN. Made RN aware of all concerns and pt's performance. At this time, pt has been unable to meet her PT goals/is a high fall risk and thus is not ready to d/c home at this time.        Recommendations for follow up therapy are one component of a multi-disciplinary discharge planning process, led by the attending physician.  Recommendations may be updated based on patient status, additional functional criteria and insurance authorization.  Follow Up Recommendations Home health PT    Assistance Recommended at Discharge Frequent or constant Supervision/Assistance  Functional Status Assessment Patient has had a recent decline in their functional status and demonstrates the ability to make significant improvements in function in a reasonable and predictable amount of time.  Equipment Recommendations  Rolling walker (2 wheels)    Recommendations for Other Services       Precautions / Restrictions Precautions Precautions: Knee;Fall Restrictions Weight Bearing Restrictions: No Other Position/Activity Restrictions: WBAT      Mobility  Bed Mobility Overal bed mobility:  Needs Assistance Bed Mobility: Supine to Sit     Supine to sit: Min guard;HOB elevated     General bed mobility comments: Min guard for safety. Pt denied dizziness.    Transfers Overall transfer level: Needs assistance Equipment used: Rolling walker (2 wheels) Transfers: Sit to/from Stand;Bed to chair/wheelchair/BSC Sit to Stand: Mod assist     Squat pivot transfers: Min assist     General transfer comment: Pt able to stand but unable to take any steps 2* R LE buckling. She immediately urinated onto floor once standing. Performed squat pivot, bed >bsc, then bsc>recliner with Min A.    Ambulation/Gait               General Gait Details: Nt-uanble to safely attempt 2* R LE buckling  Stairs            Wheelchair Mobility    Modified Rankin (Stroke Patients Only)       Balance Overall balance assessment: Needs assistance         Standing balance support: Bilateral upper extremity supported Standing balance-Leahy Scale: Poor                               Pertinent Vitals/Pain Pain Assessment: 0-10 Pain Score: 4  Pain Location: R thigh Pain Descriptors / Indicators: Aching;Discomfort;Sore Pain Intervention(s): Limited activity within patient's tolerance;Monitored during session    Home Living Family/patient expects to be discharged to:: Private residence Living Arrangements: Spouse/significant other;Children   Type of Home: House Home Access: Stairs to enter   [employer] of  Steps: 1   Home Layout: One level Home Equipment: None      Prior Function                       Hand Dominance        Extremity/Trunk Assessment   Upper Extremity Assessment Upper Extremity Assessment: Generalized weakness    Lower Extremity Assessment Lower Extremity Assessment: RLE deficits/detail RLE Deficits / Details: unable to SLR without significant extensor lag RLE Sensation: decreased light touch;decreased  proprioception RLE Coordination: decreased gross motor;decreased fine motor    Cervical / Trunk Assessment Cervical / Trunk Assessment: Normal  Communication   Communication: No difficulties  Cognition Arousal/Alertness: Awake/alert Behavior During Therapy: WFL for tasks assessed/performed Overall Cognitive Status: Within Functional Limits for tasks assessed                                          General Comments      Exercises Total Joint Exercises Ankle Circles/Pumps: PROM;Both;10 reps Quad Sets: AROM;Right;10 reps Heel Slides: AAROM;Right;10 reps;Supine Hip ABduction/ADduction: AAROM;Right;10 reps;Supine Straight Leg Raises: AAROM;Right;10 reps;Supine Goniometric ROM: ~10-65 degrees   Assessment/Plan    PT Assessment Patient needs continued PT services  PT Problem List Decreased strength;Decreased mobility;Decreased activity tolerance;Decreased knowledge of use of DME;Decreased balance;Decreased safety awareness       PT Treatment Interventions DME instruction;Therapeutic activities;Gait training;Therapeutic exercise;Patient/family education;Functional mobility training;Balance training    PT Goals (Current goals can be found in the Care Plan section)  Acute Rehab PT Goals Patient Stated Goal: home! PT Goal Formulation: With patient Time For Goal Achievement: 06/15/21 Potential to Achieve Goals: Good    Frequency 7X/week   Barriers to discharge        Co-evaluation               AM-PAC PT "6 Clicks" Mobility  Outcome Measure Help needed turning from your back to your side while in a flat bed without using bedrails?: A Little Help needed moving from lying on your back to sitting on the side of a flat bed without using bedrails?: A Little Help needed moving to and from a bed to a chair (including a wheelchair)?: A Lot Help needed standing up from a chair using your arms (e.g., wheelchair or bedside chair)?: A Lot Help needed to walk in  hospital room?: Total Help needed climbing 3-5 steps with a railing? : Total 6 Click Score: 12    End of Session Equipment Utilized During Treatment: Gait belt Activity Tolerance: Patient tolerated treatment well Patient left: in chair;with call bell/phone within reach   PT Visit Diagnosis: Muscle weakness (generalized) (M62.81);Difficulty in walking, not elsewhere classified (R26.2)    Time: 9323-5573 PT Time Calculation (min) (ACUTE ONLY): 46 min   Charges:   PT Evaluation $PT Eval Low Complexity: 1 Low PT Treatments $Therapeutic Activity: 8-22 mins         Faye Ramsay, PT Acute Rehabilitation  Office: [phone] Pager: [phone]

## 2021-06-02 ENCOUNTER — Encounter (HOSPITAL_COMMUNITY): Payer: Self-pay | Admitting: Orthopaedic Surgery

## 2021-06-02 DIAGNOSIS — M1711 Unilateral primary osteoarthritis, right knee: Secondary | ICD-10-CM | POA: Diagnosis not present

## 2021-06-02 LAB — CBC
HCT: 33.9 % — ABNORMAL LOW (ref 36.0–46.0)
Hemoglobin: 11.1 g/dL — ABNORMAL LOW (ref 12.0–15.0)
MCH: 31.8 pg (ref 26.0–34.0)
MCHC: 32.7 g/dL (ref 30.0–36.0)
MCV: 97.1 fL (ref 80.0–100.0)
Platelets: 231 10*3/uL (ref 150–400)
RBC: 3.49 MIL/uL — ABNORMAL LOW (ref 3.87–5.11)
RDW: 12.6 % (ref 11.5–15.5)
WBC: 13.5 10*3/uL — ABNORMAL HIGH (ref 4.0–10.5)
nRBC: 0 % (ref 0.0–0.2)

## 2021-06-02 LAB — BASIC METABOLIC PANEL
Anion gap: 6 (ref 5–15)
BUN: 20 mg/dL (ref 8–23)
CO2: 27 mmol/L (ref 22–32)
Calcium: 8.3 mg/dL — ABNORMAL LOW (ref 8.9–10.3)
Chloride: 100 mmol/L (ref 98–111)
Creatinine, Ser: 1.04 mg/dL — ABNORMAL HIGH (ref 0.44–1.00)
GFR, Estimated: 55 mL/min — ABNORMAL LOW (ref >60)
Glucose, Bld: 130 mg/dL — ABNORMAL HIGH (ref 70–99)
Potassium: 3.6 mmol/L (ref 3.5–5.1)
Sodium: 133 mmol/L — ABNORMAL LOW (ref 135–145)

## 2021-06-02 MED ORDER — SODIUM CHLORIDE 0.9 % IV BOLUS
500.0000 mL | Freq: Once | INTRAVENOUS | Status: AC
  Administered 2021-06-02: 15:00:00 500 mL via INTRAVENOUS

## 2021-06-02 NOTE — Progress Notes (Signed)
Subjective: 1 Day Post-Op Procedure(s) (LRB): RIGHT TOTAL KNEE ARTHROPLASTY (Right)  Patient is feeling better this morning and her leg is more stable with standing. She is looking forward to going home.  Activity level:  wbat Diet tolerance:  ok Voiding:  ok Patient reports pain as mild.    Objective: Vital signs in last 24 hours: Temp:  [97.6 F (36.4 C)-98.3 F (36.8 C)] 98.2 F (36.8 C) (12/14 0645) Pulse Rate:  [69-97] 82 (12/14 0645) Resp:  [14-20] 18 (12/14 0645) BP: (118-163)/(62-83) 132/63 (12/14 0645) SpO2:  [94 %-100 %] 96 % (12/14 0645) Weight:  [74.6 kg] 74.6 kg (12/13 1930)  Labs: No results for input(s): HGB in the last 72 hours. No results for input(s): WBC, RBC, HCT, PLT in the last 72 hours. No results for input(s): NA, K, CL, CO2, BUN, CREATININE, GLUCOSE, CALCIUM in the last 72 hours. No results for input(s): LABPT, INR in the last 72 hours.  Physical Exam:  Neurologically intact ABD soft Neurovascular intact Sensation intact distally Intact pulses distally Dorsiflexion/Plantar flexion intact Incision: dressing C/D/I and no drainage No cellulitis present Compartment soft  Assessment/Plan:  1 Day Post-Op Procedure(s) (LRB): RIGHT TOTAL KNEE ARTHROPLASTY (Right) Advance diet Up with therapy D/C IV fluids Discharge home today if cleared by PT and doing well. We will start her in outpatient PT and hold off on Home health PT. Follow up in office 2 weeks post op. Continue on 81mg  asa BID for DVT prevention.  Bushra Denman 06/02/2021, 8:02 AM

## 2021-06-02 NOTE — Discharge Summary (Deleted)
Patient ID: Whitney Rogers MRN: [id number] DOB/AGE: 26-Oct-1945 75 y.o.  Admit date: 06/01/2021 Discharge date: 06/02/2021  Admission Diagnoses:  Principal Problem:   Primary osteoarthritis of right knee Active Problems:   S/P total knee arthroplasty, right   Discharge Diagnoses:  Same  Past Medical History:  Diagnosis Date   Anxiety    CRF (chronic renal failure)    Hypertension    Hypothyroidism    Osteopenia     Surgeries: Procedure(s): RIGHT TOTAL KNEE ARTHROPLASTY on 06/01/2021   Consultants:   Discharged Condition: Improved  Hospital Course: Whitney Rogers is an 75 y.o. female who was admitted 06/01/2021 for operative treatment ofPrimary osteoarthritis of right knee. Patient has severe unremitting pain that affects sleep, daily activities, and work/hobbies. After pre-op clearance the patient was taken to the operating room on 06/01/2021 and underwent  Procedure(s): RIGHT TOTAL KNEE ARTHROPLASTY.    Patient was given perioperative antibiotics:  Anti-infectives (From admission, onward)    Start     Dose/Rate Route Frequency Ordered Stop   06/01/21 1424  ceFAZolin (ANCEF) 2-4 GM/100ML-% IVPB       Note to Pharmacy: Melina Fiddler J: cabinet override      06/01/21 1424 06/01/21 1515   06/01/21 1330  ceFAZolin (ANCEF) IVPB 2g/100 mL premix        2 g 200 mL/hr over 30 Minutes Intravenous Every 6 hours 06/01/21 0941 06/01/21 2330   06/01/21 0600  ceFAZolin (ANCEF) IVPB 2g/100 mL premix        2 g 200 mL/hr over 30 Minutes Intravenous On call to O.R. 06/01/21 0530 06/01/21 0744        Patient was given sequential compression devices, early ambulation, and chemoprophylaxis to prevent DVT.  Patient benefited maximally from hospital stay and there were no complications.    Recent vital signs: Patient Vitals for the past 24 hrs:  BP Temp Temp src Pulse Resp SpO2 Height Weight  06/02/21 0645 132/63 98.2 F (36.8 C) Oral 82 18 96 % -- --  06/02/21 0147 (!)  142/62 98.1 F (36.7 C) Oral 80 18 95 % -- --  06/01/21 2221 (!) 150/77 98.3 F (36.8 C) Oral 89 18 97 % -- --  06/01/21 1930 -- -- -- -- -- -- 5\' 3"  (1.6 m) 74.6 kg  06/01/21 1919 (!) 155/62 -- -- 83 17 99 % -- --  06/01/21 1830 (!) 147/65 97.7 F (36.5 C) -- 80 18 97 % -- --  06/01/21 1645 (!) 151/76 -- -- 86 17 96 % -- --  06/01/21 1600 (!) 152/67 -- -- 97 -- 97 % -- --  06/01/21 1530 (!) 153/78 -- -- -- -- 95 % -- --  06/01/21 1436 (!) 148/75 -- -- -- -- 97 % -- --  06/01/21 1345 (!) 163/78 -- -- -- -- 97 % -- --  06/01/21 1300 (!) 148/75 -- -- -- -- -- -- --  06/01/21 1200 (!) 161/68 -- -- -- -- -- -- --  06/01/21 1111 -- 97.6 F (36.4 C) Oral -- -- -- -- --  06/01/21 1100 140/81 -- -- 75 -- 96 % -- --  06/01/21 1030 140/80 98.2 F (36.8 C) -- 95 20 94 % -- --  06/01/21 1015 118/70 -- -- 74 16 100 % -- --  06/01/21 1000 124/63 -- -- 69 17 96 % -- --  06/01/21 0945 132/83 -- -- 70 15 100 % -- --  06/01/21 0935 133/68 98.2 F (36.8 C) -- 76  14 100 % -- --     Recent laboratory studies: No results for input(s): WBC, HGB, HCT, PLT, NA, K, CL, CO2, BUN, CREATININE, GLUCOSE, INR, CALCIUM in the last 72 hours.  Invalid input(s): PT, 2   Discharge Medications:   Allergies as of 06/02/2021       Reactions   Lisinopril Cough        Medication List     STOP taking these medications    celecoxib 200 MG capsule Commonly known as: CELEBREX       TAKE these medications    ALPRAZolam 0.5 MG tablet Commonly known as: XANAX Take 0.75 mg by mouth at bedtime.   Calcium 600-200 MG-UNIT tablet Take 1 tablet by mouth daily.   estradiol 0.05 mg/24hr patch Commonly known as: CLIMARA - Dosed in mg/24 hr Place 0.05 mg onto the skin once a week.   levothyroxine 75 MCG tablet Commonly known as: SYNTHROID Take 75 mcg by mouth daily before breakfast.   Moderna COVID-19 Bival Booster 50 MCG/0.5ML injection Generic drug: COVID-19 mRNA bivalent vaccine (Moderna) Inject into  the muscle.   multivitamin tablet Take 1 tablet by mouth daily.   progesterone 100 MG capsule Commonly known as: PROMETRIUM Take 100 mg by mouth daily.   solifenacin 5 MG tablet Commonly known as: VESICARE Take 5 mg by mouth daily.   traMADol 50 MG tablet Commonly known as: ULTRAM Take 50 mg by mouth at bedtime.   VITAMIN C PO Take 1 tablet by mouth daily.               Durable Medical Equipment  (From admission, onward)           Start     Ordered   06/01/21 1854  DME Walker rolling  Once       Question:  Patient needs a walker to treat with the following condition  Answer:  Primary osteoarthritis of right knee   06/01/21 1853   06/01/21 1854  DME 3 n 1  Once        06/01/21 1853   06/01/21 1854  DME Bedside commode  Once       Question:  Patient needs a bedside commode to treat with the following condition  Answer:  Primary osteoarthritis of right knee   06/01/21 1853            Diagnostic Studies: No results found.  Disposition: Discharge disposition: 01-Home or Self Care       Discharge Instructions     Call MD / Call 911   Complete by: As directed    If you experience chest pain or shortness of breath, CALL 911 and be transported to the hospital emergency room.  If you develope a fever above 101 F, pus (white drainage) or increased drainage or redness at the wound, or calf pain, call your surgeon's office.   Call MD / Call 911   Complete by: As directed    If you experience chest pain or shortness of breath, CALL 911 and be transported to the hospital emergency room.  If you develope a fever above 101 F, pus (white drainage) or increased drainage or redness at the wound, or calf pain, call your surgeon's office.   Constipation Prevention   Complete by: As directed    Drink plenty of fluids.  Prune juice may be helpful.  You may use a stool softener, such as Colace (over the counter) 100 mg twice a day.  Use MiraLax (  over the counter) for  constipation as needed.   Constipation Prevention   Complete by: As directed    Drink plenty of fluids.  Prune juice may be helpful.  You may use a stool softener, such as Colace (over the counter) 100 mg twice a day.  Use MiraLax (over the counter) for constipation as needed.   Diet - low sodium heart healthy   Complete by: As directed    Diet - low sodium heart healthy   Complete by: As directed    Discharge instructions   Complete by: As directed    INSTRUCTIONS AFTER JOINT REPLACEMENT   Remove items at home which could result in a fall. This includes throw rugs or furniture in walking pathways ICE to the affected joint every three hours while awake for 30 minutes at a time, for at least the first 3-5 days, and then as needed for pain and swelling.  Continue to use ice for pain and swelling. You may notice swelling that will progress down to the foot and ankle.  This is normal after surgery.  Elevate your leg when you are not up walking on it.   Continue to use the breathing machine you got in the hospital (incentive spirometer) which will help keep your temperature down.  It is common for your temperature to cycle up and down following surgery, especially at night when you are not up moving around and exerting yourself.  The breathing machine keeps your lungs expanded and your temperature down.   DIET:  As you were doing prior to hospitalization, we recommend a well-balanced diet.  DRESSING / WOUND CARE / SHOWERING  You may shower 3 days after surgery, but keep the wounds dry during showering.  You may use an occlusive plastic wrap (Press'n Seal for example), NO SOAKING/SUBMERGING IN THE BATHTUB.  If the bandage gets wet, change with a clean dry gauze.  If the incision gets wet, pat the wound dry with a clean towel.  ACTIVITY  Increase activity slowly as tolerated, but follow the weight bearing instructions below.   No driving for 6 weeks or until further direction given by your  physician.  You cannot drive while taking narcotics.  No lifting or carrying greater than 10 lbs. until further directed by your surgeon. Avoid periods of inactivity such as sitting longer than an hour when not asleep. This helps prevent blood clots.  You may return to work once you are authorized by your doctor.     WEIGHT BEARING   Weight bearing as tolerated with assist device (walker, cane, etc) as directed, use it as long as suggested by your surgeon or therapist, typically at least 4-6 weeks.   EXERCISES  Results after joint replacement surgery are often greatly improved when you follow the exercise, range of motion and muscle strengthening exercises prescribed by your doctor. Safety measures are also important to protect the joint from further injury. Any time any of these exercises cause you to have increased pain or swelling, decrease what you are doing until you are comfortable again and then slowly increase them. If you have problems or questions, call your caregiver or physical therapist for advice.   Rehabilitation is important following a joint replacement. After just a few days of immobilization, the muscles of the leg can become weakened and shrink (atrophy).  These exercises are designed to build up the tone and strength of the thigh and leg muscles and to improve motion. Often times heat used for twenty  to thirty minutes before working out will loosen up your tissues and help with improving the range of motion but do not use heat for the first two weeks following surgery (sometimes heat can increase post-operative swelling).   These exercises can be done on a training (exercise) mat, on the floor, on a table or on a bed. Use whatever works the best and is most comfortable for you.    Use music or television while you are exercising so that the exercises are a pleasant break in your day. This will make your life better with the exercises acting as a break in your routine that you  can look forward to.   Perform all exercises about fifteen times, three times per day or as directed.  You should exercise both the operative leg and the other leg as well.  Exercises include:   Quad Sets - Tighten up the muscle on the front of the thigh (Quad) and hold for 5-10 seconds.   Straight Leg Raises - With your knee straight (if you were given a brace, keep it on), lift the leg to 60 degrees, hold for 3 seconds, and slowly lower the leg.  Perform this exercise against resistance later as your leg gets stronger.  Leg Slides: Lying on your back, slowly slide your foot toward your buttocks, bending your knee up off the floor (only go as far as is comfortable). Then slowly slide your foot back down until your leg is flat on the floor again.  Angel Wings: Lying on your back spread your legs to the side as far apart as you can without causing discomfort.  Hamstring Strength:  Lying on your back, push your heel against the floor with your leg straight by tightening up the muscles of your buttocks.  Repeat, but this time bend your knee to a comfortable angle, and push your heel against the floor.  You may put a pillow under the heel to make it more comfortable if necessary.   A rehabilitation program following joint replacement surgery can speed recovery and prevent re-injury in the future due to weakened muscles. Contact your doctor or a physical therapist for more information on knee rehabilitation.    CONSTIPATION  Constipation is defined medically as fewer than three stools per week and severe constipation as less than one stool per week.  Even if you have a regular bowel pattern at home, your normal regimen is likely to be disrupted due to multiple reasons following surgery.  Combination of anesthesia, postoperative narcotics, change in appetite and fluid intake all can affect your bowels.   YOU MUST use at least one of the following options; they are listed in order of increasing strength to  get the job done.  They are all available over the counter, and you may need to use some, POSSIBLY even all of these options:    Drink plenty of fluids (prune juice may be helpful) and high fiber foods Colace 100 mg by mouth twice a day  Senokot for constipation as directed and as needed Dulcolax (bisacodyl), take with full glass of water  Miralax (polyethylene glycol) once or twice a day as needed.  If you have tried all these things and are unable to have a bowel movement in the first 3-4 days after surgery call either your surgeon or your primary doctor.    If you experience loose stools or diarrhea, hold the medications until you stool forms back up.  If your symptoms do not get  better within 1 week or if they get worse, check with your doctor.  If you experience "the worst abdominal pain ever" or develop nausea or vomiting, please contact the office immediately for further recommendations for treatment.   ITCHING:  If you experience itching with your medications, try taking only a single pain pill, or even half a pain pill at a time.  You can also use Benadryl over the counter for itching or also to help with sleep.   TED HOSE STOCKINGS:  Use stockings on both legs until for at least 2 weeks or as directed by physician office. They may be removed at night for sleeping.  MEDICATIONS:  See your medication summary on the "After Visit Summary" that nursing will review with you.  You may have some home medications which will be placed on hold until you complete the course of blood thinner medication.  It is important for you to complete the blood thinner medication as prescribed.  PRECAUTIONS:  If you experience chest pain or shortness of breath - call 911 immediately for transfer to the hospital emergency department.   If you develop a fever greater that 101 F, purulent drainage from wound, increased redness or drainage from wound, foul odor from the wound/dressing, or calf pain - CONTACT YOUR  SURGEON.                                                   FOLLOW-UP APPOINTMENTS:  If you do not already have a post-op appointment, please call the office for an appointment to be seen by your surgeon.  Guidelines for how soon to be seen are listed in your "After Visit Summary", but are typically between 1-4 weeks after surgery.  OTHER INSTRUCTIONS:   Knee Replacement:  Do not place pillow under knee, focus on keeping the knee straight while resting. CPM instructions: 0-90 degrees, 2 hours in the morning, 2 hours in the afternoon, and 2 hours in the evening. Place foam block, curve side up under heel at all times except when in CPM or when walking.  DO NOT modify, tear, cut, or change the foam block in any way.  POST-OPERATIVE OPIOID TAPER INSTRUCTIONS: It is important to wean off of your opioid medication as soon as possible. If you do not need pain medication after your surgery it is ok to stop day one. Opioids include: Codeine, Hydrocodone(Norco, Vicodin), Oxycodone(Percocet, oxycontin) and hydromorphone amongst others.  Long term and even short term use of opiods can cause: Increased pain response Dependence Constipation Depression Respiratory depression And more.  Withdrawal symptoms can include Flu like symptoms Nausea, vomiting And more Techniques to manage these symptoms Hydrate well Eat regular healthy meals Stay active Use relaxation techniques(deep breathing, meditating, yoga) Do Not substitute Alcohol to help with tapering If you have been on opioids for less than two weeks and do not have pain than it is ok to stop all together.  Plan to wean off of opioids This plan should start within one week post op of your joint replacement. Maintain the same interval or time between taking each dose and first decrease the dose.  Cut the total daily intake of opioids by one tablet each day Next start to increase the time between doses. The last dose that should be eliminated is  the evening dose.     MAKE  SURE YOU:  Understand these instructions.  Get help right away if you are not doing well or get worse.    Thank you for letting us be a part of your medical care team.  It is a privilege we respect greatly.  We hope these instructions will help you stay on track for a fast and full recovery!   Discharge instructions   Complete by: As directed    INSTRUCTIONS AFTER JOINT REPLACEMENT   Remove items at home which could result in a fall. This includes throw rugs or furniture in walking pathways ICE to the affected joint every three hours while awake for 30 minutes at a time, for at least the first 3-5 days, and then as needed for pain and swelling.  Continue to use ice for pain and swelling. You may notice swelling that will progress down to the foot and ankle.  This is normal after surgery.  Elevate your leg when you are not up walking on it.   Continue to use the breathing machine you got in the hospital (incentive spirometer) which will help keep your temperature down.  It is common for your temperature to cycle up and down following surgery, especially at night when you are not up moving around and exerting yourself.  The breathing machine keeps your lungs expanded and your temperature down.   DIET:  As you were doing prior to hospitalization, we recommend a well-balanced diet.  DRESSING / WOUND CARE / SHOWERING  You may shower 3 days after surgery, but keep the wounds dry during showering.  You may use an occlusive plastic wrap (Press'n Seal for example), NO SOAKING/SUBMERGING IN THE BATHTUB.  If the bandage gets wet, change with a clean dry gauze.  If the incision gets wet, pat the wound dry with a clean towel.  ACTIVITY  Increase activity slowly as tolerated, but follow the weight bearing instructions below.   No driving for 6 weeks or until further direction given by your physician.  You cannot drive while taking narcotics.  No lifting or carrying greater  than 10 lbs. until further directed by your surgeon. Avoid periods of inactivity such as sitting longer than an hour when not asleep. This helps prevent blood clots.  You may return to work once you are authorized by your doctor.     WEIGHT BEARING   Weight bearing as tolerated with assist device (walker, cane, etc) as directed, use it as long as suggested by your surgeon or therapist, typically at least 4-6 weeks.   EXERCISES  Results after joint replacement surgery are often greatly improved when you follow the exercise, range of motion and muscle strengthening exercises prescribed by your doctor. Safety measures are also important to protect the joint from further injury. Any time any of these exercises cause you to have increased pain or swelling, decrease what you are doing until you are comfortable again and then slowly increase them. If you have problems or questions, call your caregiver or physical therapist for advice.   Rehabilitation is important following a joint replacement. After just a few days of immobilization, the muscles of the leg can become weakened and shrink (atrophy).  These exercises are designed to build up the tone and strength of the thigh and leg muscles and to improve motion. Often times heat used for twenty to thirty minutes before working out will loosen up your tissues and help with improving the range of motion but do not use heat for the first two  weeks following surgery (sometimes heat can increase post-operative swelling).   These exercises can be done on a training (exercise) mat, on the floor, on a table or on a bed. Use whatever works the best and is most comfortable for you.    Use music or television while you are exercising so that the exercises are a pleasant break in your day. This will make your life better with the exercises acting as a break in your routine that you can look forward to.   Perform all exercises about fifteen times, three times per day  or as directed.  You should exercise both the operative leg and the other leg as well.  Exercises include:   Quad Sets - Tighten up the muscle on the front of the thigh (Quad) and hold for 5-10 seconds.   Straight Leg Raises - With your knee straight (if you were given a brace, keep it on), lift the leg to 60 degrees, hold for 3 seconds, and slowly lower the leg.  Perform this exercise against resistance later as your leg gets stronger.  Leg Slides: Lying on your back, slowly slide your foot toward your buttocks, bending your knee up off the floor (only go as far as is comfortable). Then slowly slide your foot back down until your leg is flat on the floor again.  Angel Wings: Lying on your back spread your legs to the side as far apart as you can without causing discomfort.  Hamstring Strength:  Lying on your back, push your heel against the floor with your leg straight by tightening up the muscles of your buttocks.  Repeat, but this time bend your knee to a comfortable angle, and push your heel against the floor.  You may put a pillow under the heel to make it more comfortable if necessary.   A rehabilitation program following joint replacement surgery can speed recovery and prevent re-injury in the future due to weakened muscles. Contact your doctor or a physical therapist for more information on knee rehabilitation.    CONSTIPATION  Constipation is defined medically as fewer than three stools per week and severe constipation as less than one stool per week.  Even if you have a regular bowel pattern at home, your normal regimen is likely to be disrupted due to multiple reasons following surgery.  Combination of anesthesia, postoperative narcotics, change in appetite and fluid intake all can affect your bowels.   YOU MUST use at least one of the following options; they are listed in order of increasing strength to get the job done.  They are all available over the counter, and you may need to use  some, POSSIBLY even all of these options:    Drink plenty of fluids (prune juice may be helpful) and high fiber foods Colace 100 mg by mouth twice a day  Senokot for constipation as directed and as needed Dulcolax (bisacodyl), take with full glass of water  Miralax (polyethylene glycol) once or twice a day as needed.  If you have tried all these things and are unable to have a bowel movement in the first 3-4 days after surgery call either your surgeon or your primary doctor.    If you experience loose stools or diarrhea, hold the medications until you stool forms back up.  If your symptoms do not get better within 1 week or if they get worse, check with your doctor.  If you experience "the worst abdominal pain ever" or develop nausea or vomiting, please  contact the office immediately for further recommendations for treatment.   ITCHING:  If you experience itching with your medications, try taking only a single pain pill, or even half a pain pill at a time.  You can also use Benadryl over the counter for itching or also to help with sleep.   TED HOSE STOCKINGS:  Use stockings on both legs until for at least 2 weeks or as directed by physician office. They may be removed at night for sleeping.  MEDICATIONS:  See your medication summary on the "After Visit Summary" that nursing will review with you.  You may have some home medications which will be placed on hold until you complete the course of blood thinner medication.  It is important for you to complete the blood thinner medication as prescribed.  PRECAUTIONS:  If you experience chest pain or shortness of breath - call 911 immediately for transfer to the hospital emergency department.   If you develop a fever greater that 101 F, purulent drainage from wound, increased redness or drainage from wound, foul odor from the wound/dressing, or calf pain - CONTACT YOUR SURGEON.                                                   FOLLOW-UP APPOINTMENTS:   If you do not already have a post-op appointment, please call the office for an appointment to be seen by your surgeon.  Guidelines for how soon to be seen are listed in your "After Visit Summary", but are typically between 1-4 weeks after surgery.  OTHER INSTRUCTIONS:   Knee Replacement:  Do not place pillow under knee, focus on keeping the knee straight while resting. CPM instructions: 0-90 degrees, 2 hours in the morning, 2 hours in the afternoon, and 2 hours in the evening. Place foam block, curve side up under heel at all times except when in CPM or when walking.  DO NOT modify, tear, cut, or change the foam block in any way.  POST-OPERATIVE OPIOID TAPER INSTRUCTIONS: It is important to wean off of your opioid medication as soon as possible. If you do not need pain medication after your surgery it is ok to stop day one. Opioids include: Codeine, Hydrocodone(Norco, Vicodin), Oxycodone(Percocet, oxycontin) and hydromorphone amongst others.  Long term and even short term use of opiods can cause: Increased pain response Dependence Constipation Depression Respiratory depression And more.  Withdrawal symptoms can include Flu like symptoms Nausea, vomiting And more Techniques to manage these symptoms Hydrate well Eat regular healthy meals Stay active Use relaxation techniques(deep breathing, meditating, yoga) Do Not substitute Alcohol to help with tapering If you have been on opioids for less than two weeks and do not have pain than it is ok to stop all together.  Plan to wean off of opioids This plan should start within one week post op of your joint replacement. Maintain the same interval or time between taking each dose and first decrease the dose.  Cut the total daily intake of opioids by one tablet each day Next start to increase the time between doses. The last dose that should be eliminated is the evening dose.     MAKE SURE YOU:  Understand these instructions.  Get help  right away if you are not doing well or get worse.    Thank you for letting us  be a part of your medical care team.  It is a privilege we respect greatly.  We hope these instructions will help you stay on track for a fast and full recovery!   Increase activity slowly as tolerated   Complete by: As directed    Increase activity slowly as tolerated   Complete by: As directed    Post-operative opioid taper instructions:   Complete by: As directed    POST-OPERATIVE OPIOID TAPER INSTRUCTIONS: It is important to wean off of your opioid medication as soon as possible. If you do not need pain medication after your surgery it is ok to stop day one. Opioids include: Codeine, Hydrocodone(Norco, Vicodin), Oxycodone(Percocet, oxycontin) and hydromorphone amongst others.  Long term and even short term use of opiods can cause: Increased pain response Dependence Constipation Depression Respiratory depression And more.  Withdrawal symptoms can include Flu like symptoms Nausea, vomiting And more Techniques to manage these symptoms Hydrate well Eat regular healthy meals Stay active Use relaxation techniques(deep breathing, meditating, yoga) Do Not substitute Alcohol to help with tapering If you have been on opioids for less than two weeks and do not have pain than it is ok to stop all together.  Plan to wean off of opioids This plan should start within one week post op of your joint replacement. Maintain the same interval or time between taking each dose and first decrease the dose.  Cut the total daily intake of opioids by one tablet each day Next start to increase the time between doses. The last dose that should be eliminated is the evening dose.      Post-operative opioid taper instructions:   Complete by: As directed    POST-OPERATIVE OPIOID TAPER INSTRUCTIONS: It is important to wean off of your opioid medication as soon as possible. If you do not need pain medication after your surgery  it is ok to stop day one. Opioids include: Codeine, Hydrocodone(Norco, Vicodin), Oxycodone(Percocet, oxycontin) and hydromorphone amongst others.  Long term and even short term use of opiods can cause: Increased pain response Dependence Constipation Depression Respiratory depression And more.  Withdrawal symptoms can include Flu like symptoms Nausea, vomiting And more Techniques to manage these symptoms Hydrate well Eat regular healthy meals Stay active Use relaxation techniques(deep breathing, meditating, yoga) Do Not substitute Alcohol to help with tapering If you have been on opioids for less than two weeks and do not have pain than it is ok to stop all together.  Plan to wean off of opioids This plan should start within one week post op of your joint replacement. Maintain the same interval or time between taking each dose and first decrease the dose.  Cut the total daily intake of opioids by one tablet each day Next start to increase the time between doses. The last dose that should be eliminated is the evening dose.           Follow-up Information     Marcene Corning, MD. Schedule an appointment as soon as possible for a visit in 2 week(s).   Specialty: Orthopedic Surgery Contact information: [address] [place] [address] [phone]                  Signed: Ginger Organ Lakedra Washington 06/02/2021, 8:05 AM

## 2021-06-02 NOTE — TOC Transition Note (Signed)
Transition of Care [facility]) - CM/SW Discharge Note  Patient Details  Name: Whitney Rogers MRN: [id number] Date of Birth: 15-May-1946  Transition of Care [facility] CM/SW Contact:  Ewing Schlein, LCSW Phone Number: 06/02/2021, 1:23 PM  Clinical Narrative: Patient is expected to discharge home after working with PT. CSW spoke with patient to confirm discharge plan and needs. Patient reported she will not be discharging home with HHPT now, but will got to OPPT at [facility] PT where she was going prior to surgery. Patient has a 3N1 and cane at home, but will need a rolling walker. [employer] delivered walker to patient's room. TOC signing off.  Final next level of care: OP Rehab Barriers to Discharge: No Barriers Identified  Patient Goals and CMS Choice Patient states their goals for this hospitalization and ongoing recovery are:: Go to OPPT at [facility] PT CMS Medicare.gov Compare Post Acute Care list provided to:: Patient Choice offered to / list presented to : Patient  Discharge Plan and Services        DME Arranged: Walker rolling DME Agency: [employer] Representative spoke with at DME Agency: Prearranged in orthopedist's office  Readmission Risk Interventions No flowsheet data found.

## 2021-06-02 NOTE — Anesthesia Postprocedure Evaluation (Signed)
Anesthesia Post Note  Patient: Whitney Rogers  Procedure(s) Performed: RIGHT TOTAL KNEE ARTHROPLASTY (Right: Knee)     Patient location during evaluation: PACU Anesthesia Type: Spinal and Regional Level of consciousness: oriented and awake and alert Pain management: pain level controlled Vital Signs Assessment: post-procedure vital signs reviewed and stable Respiratory status: spontaneous breathing, respiratory function stable and patient connected to nasal cannula oxygen Cardiovascular status: blood pressure returned to baseline and stable Postop Assessment: no headache, no backache and no apparent nausea or vomiting Anesthetic complications: no   No notable events documented.  Last Vitals:  Vitals:   06/02/21 0147 06/02/21 0645  BP: (!) 142/62 132/63  Pulse: 80 82  Resp: 18 18  Temp: 36.7 C 36.8 C  SpO2: 95% 96%    Last Pain:  Vitals:   06/02/21 0800  TempSrc:   PainSc: Asleep                 [name]

## 2021-06-02 NOTE — Plan of Care (Signed)
  Problem: Activity: Goal: Risk for activity intolerance will decrease Outcome: Progressing   Problem: Pain Managment: Goal: General experience of comfort will improve Outcome: Progressing   Problem: Safety: Goal: Ability to remain free from injury will improve Outcome: Progressing   

## 2021-06-02 NOTE — Progress Notes (Signed)
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 Hx of renal failure, osteopenia    PT Comments    Pt with excellent quad activation, ROM, and pain control; however, was limited by orthostatic hypotension with ambulation.  Notified RN. Will f/u in afternoon.   Recommendations for follow up therapy are one component of a multi-disciplinary discharge planning process, led by the attending physician.  Recommendations may be updated based on patient status, additional functional criteria and insurance authorization.  Follow Up Recommendations  Home health PT     Assistance Recommended at Discharge Frequent or constant Supervision/Assistance  Equipment Recommendations  Rolling walker (2 wheels)    Recommendations for Other Services       Precautions / Restrictions Precautions Precautions: Fall;Knee Restrictions Weight Bearing Restrictions: Yes RLE Weight Bearing: Weight bearing as tolerated     Mobility  Bed Mobility Overal bed mobility: Needs Assistance Bed Mobility: Supine to Sit     Supine to sit: Supervision          Transfers Overall transfer level: Needs assistance Equipment used: Rolling walker (2 wheels) Transfers: Sit to/from Stand Sit to Stand: Min guard           General transfer comment: Performed x 2 with min guard for safety; Cues for safety and hand placement    Ambulation/Gait Ambulation/Gait assistance: Min guard Gait Distance (Feet): 100 Feet Assistive device: Rolling walker (2 wheels) Gait Pattern/deviations: Step-to pattern;Decreased stride length Gait velocity: decreased     General Gait Details: Cues for RW and sequencing and safety; decreased weight shift to R ; limited due to lightheadedness   Stairs             Wheelchair Mobility    Modified Rankin (Stroke Patients Only)        Balance Overall balance assessment: Needs assistance Sitting-balance support: No upper extremity supported Sitting balance-Leahy Scale: Good     Standing balance support: Bilateral upper extremity supported;Reliant on assistive device for balance Standing balance-Leahy Scale: Poor Standing balance comment: Using RW but stable RW                            Cognition Arousal/Alertness: Awake/alert Behavior During Therapy: WFL for tasks assessed/performed Overall Cognitive Status: Within Functional Limits for tasks assessed                                          Exercises Total Joint Exercises Ankle Circles/Pumps: AROM;Both;10 reps;Supine Quad Sets: AROM;Both;10 reps;Supine Towel Squeeze: AROM;Both;10 reps;Supine Heel Slides: AROM;Both;10 reps;Supine Hip ABduction/ADduction: AROM;Both;10 reps;Supine Straight Leg Raises: AROM;Both;10 reps;Supine Long Arc Quad: AROM;Both;10 reps;Seated Knee Flexion: AROM;Right;10 reps;Seated Goniometric ROM: R knee 0 to 80 degrees Other Exercises Other Exercises: Pt tolerating exercises very well; cued on correct form    General Comments General comments (skin integrity, edema, etc.): Pt moving R LE well with excellent SLR without extensor lag.  With gait pt becoming lightheaded/dizzy - BP was 82/52 upon return to sitting.  When returned to supine BP 120/59 and symptoms resolving.  Pt with questions regarding what could cause - discussed meds, first time walking, pain, etc.  Encouraged to eat and drink well and that therpay would f/u in pm  Pertinent Vitals/Pain Pain Assessment: 0-10 Pain Score: 2  Pain Location: R thigh Pain Descriptors / Indicators: Aching;Discomfort;Sore Pain Intervention(s): Limited activity within patient's tolerance;Monitored during session;Repositioned    Home Living                          Prior Function            PT Goals (current goals can now be found in  the care plan section) Progress towards PT goals: Progressing toward goals    Frequency    7X/week      PT Plan Current plan remains appropriate    Co-evaluation              AM-PAC PT "6 Clicks" Mobility   Outcome Measure  Help needed turning from your back to your side while in a flat bed without using bedrails?: A Little Help needed moving from lying on your back to sitting on the side of a flat bed without using bedrails?: A Little Help needed moving to and from a bed to a chair (including a wheelchair)?: A Little Help needed standing up from a chair using your arms (e.g., wheelchair or bedside chair)?: A Little Help needed to walk in hospital room?: A Little Help needed climbing 3-5 steps with a railing? : A Lot 6 Click Score: 17    End of Session Equipment Utilized During Treatment: Gait belt Activity Tolerance: Other (comment) (orthostatic hypotension limited) Patient left: with call bell/phone within reach;in bed;with bed alarm set Nurse Communication: Mobility status PT Visit Diagnosis: Muscle weakness (generalized) (M62.81);Difficulty in walking, not elsewhere classified (R26.2)     Time: 6503-5465 PT Time Calculation (min) (ACUTE ONLY): 44 min  Charges:  $Gait Training: 8-22 mins $Therapeutic Exercise: 8-22 mins $Therapeutic Activity: 8-22 mins                     Anise Salvo, PT Acute Rehab Services Pager [phone] [facility] [phone]    Rayetta Humphrey 06/02/2021, 12:18 PM

## 2021-06-02 NOTE — Progress Notes (Signed)
Physical Therapy Treatment Patient Details Name: Whitney Rogers MRN: [id number] DOB: [birth year] Today's Date: 06/02/2021   History of Present Illness Pt is 75 yo female s/p R TKA 06/01/21. Hx of renal failure, osteopenia    PT Comments    Pt remains limited by orthostatic hypotension and syncope.  She did not receive narcotics prior to PT and was still symptomatic of lightheaded/syncope.  Pt only ambulating 15' with BP down to 77/54 and was syncopal/nearly totally unresponsive requiring max A tx back to bed, resolving in supine.  Pt reports prior episodes of this that occurred when Hgb in 10's and improved with fluid and iron pills- pt feels that she needs iron pills - encouraged her to discuss with PA/MD. See summary of session below.  Summary: Pt sat EOB and BP was135/61.  She then ambulated 56' with PT and reported lightheadedness.  RN provided chair and PT checked BP was 77/54.  Pt reports symptoms improving and asking what could cause since she did not take medication.  Getting ready to roll pt back to room for transfer to recliner and pt reports "everything going white." Rolled pt to EOB and pt becoming mostly unresponsive and requiring max x 2 pivot back to bed.  Once in supine/trendlenberg pt becoming more alert and bp 109/69.  Pt starting to ask what could cause and also to put the World Cup on TV.  Able to progress pt back to [facility] elevated and BP 137/69.  Pt reports she may want to use bathroom prior to restarting IV.  Encouraged pt to get fluids first if she didn't really need to go to the bathroom and was just trying to do before "tied down" with IV.  Pt initially agreeing but then stating that she wanted to use BSC.  Assisted with BSC and back to bed.  Pt asymptomatic during this tx but BP not taken.  Pt reports had prior episode earlier this year in which she lost blood and Hgb was in 10's but she still felt syncopal.  She came to the ED and received fluid then she reports her PCP Dr Norma Fredrickson  put her on iron pills that really helped her get to feeling back to normal.  RN alerted PA of orthostatic hypotension and pt requesting/reporting iron pills helped in past.  RN notified pt PA ordered bolus of fluid and blood work.  Pt still expressing that in past she has felt dizzy/syncopal even with hgb at 10 , and really feels that she needs the iron pills. Pt asking who to call to get iron pills - advised therapy unable to assist and would need to discuss with MD/PA   Recommendations for follow up therapy are one component of a multi-disciplinary discharge planning process, led by the attending physician.  Recommendations may be updated based on patient status, additional functional criteria and insurance authorization.  Follow Up Recommendations  Home health PT     Assistance Recommended at Discharge Frequent or constant Supervision/Assistance  Equipment Recommendations  Rolling walker (2 wheels)    Recommendations for Other Services       Precautions / Restrictions Precautions Precautions: Fall;Knee Precaution Comments: R knee buckling Restrictions RLE Weight Bearing: Weight bearing as tolerated     Mobility  Bed Mobility Overal bed mobility: Needs Assistance Bed Mobility: Supine to Sit;Sit to Supine     Supine to sit: Min assist Sit to supine: Total assist;Min assist   General bed mobility comments: Performed supine to sit x2 with min A  for R LE; First sit to supine total assist due to syncope; second time min A for R LE    Transfers Overall transfer level: Needs assistance Equipment used: Rolling walker (2 wheels) Transfers: Sit to/from Stand Sit to Stand: Min assist;Max assist Stand pivot transfers: +2 physical assistance;Total assist   Step pivot transfers: Min assist     General transfer comment: Initial sit to stand with min A and cues for hand placement.  With ambulation pt became syncopal requiring total assist stand pivot back to bed.  After several mins of  supine , pt recovered and reports needs to use BSC.  Min A for sit to stands with min A step pivots to [facility] and back to bed with mod cues for RW use.    Ambulation/Gait Ambulation/Gait assistance: Min assist Gait Distance (Feet): 15 Feet Assistive device: Rolling walker (2 wheels) Gait Pattern/deviations: Step-to pattern;Decreased stride length Gait velocity: decreased     General Gait Details: Min cues for RW but then pt with lightheadedness and returned to sitting.   Stairs             Wheelchair Mobility    Modified Rankin (Stroke Patients Only)       Balance Overall balance assessment: Needs assistance Sitting-balance support: No upper extremity supported Sitting balance-Leahy Scale: Good     Standing balance support: Bilateral upper extremity supported;Reliant on assistive device for balance Standing balance-Leahy Scale: Poor Standing balance comment: Using RW but stable RW                            Cognition Arousal/Alertness: Awake/alert Behavior During Therapy: WFL for tasks assessed/performed Overall Cognitive Status: Within Functional Limits for tasks assessed                                          Exercises Total Joint Exercises Ankle Circles/Pumps: AROM;Both;10 reps;Supine Quad Sets: AROM;Both;10 reps;Supine Towel Squeeze: AROM;Both;10 reps;Supine Heel Slides: AROM;Both;10 reps;Supine Hip ABduction/ADduction: AROM;Both;10 reps;Supine Straight Leg Raises: AROM;Both;10 reps;Supine Long Arc Quad: AROM;Both;10 reps;Seated Knee Flexion: AROM;Right;10 reps;Seated Goniometric ROM: R knee 0 to 80 degrees Other Exercises Other Exercises: Pt tolerating exercises very well; cued on correct form    General Comments General comments (skin integrity, edema, etc.): Pt sat EOB and BP was135/61.  She then ambulated 50' with PT and reported lightheadedness.  RN provided chair and PT checked BP was 77/54.  Pt reports symptoms  improving and asking what could cause since she did not take medication.  Getting ready to roll pt back to room for transfer to recliner and pt reports "everything going white." Rolled pt to EOB and pt becoming mostly unresponsive and requiring max x 2 pivot back to bed.  Once in supine/trendlenberg pt becoming more alert and bp 109/69.  Pt starting to ask what could cause and also to put the World Cup on TV.  Able to progress pt back to [facility] elevated and BP 137/69.  Pt reports she may want to use bathroom prior to restarting IV.  Encouraged pt to get fluids first if she didn't really need to go to the bathroom and was just trying to do before "tied down" with IV.  Pt initially agreeing but then stating that she wanted to use BSC.  Assisted with BSC and back to bed.  Pt asymptomatic during this tx but  BP not taken.  Pt reports had prior episode earlier this year in which she lost blood and Hgb was in 10's but she still felt syncopal.  She came to the ED and received fluid then she reports her PCP Dr Norma Fredrickson put her on iron pills that really helped her get to feeling back to normal.  RN alerted PA of orthostatic hypotension and pt requesting/reporting iron pills helped in past.  RN notified pt PA ordered bolus of fluid and blood work.  Pt still expressing that in past she has felt dizzy/syncopal even with hgb at 10 , and really feels that she needs the iron pills. Pt asking who to call to get iron pills - advised therapy unable to assist and would need to discuss with MD/PA      Pertinent Vitals/Pain Pain Assessment: 0-10 Pain Score: 6  (6/10 movement; 4/10 rest) Pain Location: R thigh Pain Descriptors / Indicators: Aching;Discomfort;Sore Pain Intervention(s): Limited activity within patient's tolerance;Monitored during session;Repositioned;Ice applied    Home Living                          Prior Function            PT Goals (current goals can now be found in the care plan section) Progress  towards PT goals: Progressing toward goals    Frequency    7X/week      PT Plan Current plan remains appropriate    Co-evaluation              AM-PAC PT "6 Clicks" Mobility   Outcome Measure  Help needed turning from your back to your side while in a flat bed without using bedrails?: A Little Help needed moving from lying on your back to sitting on the side of a flat bed without using bedrails?: A Little Help needed moving to and from a bed to a chair (including a wheelchair)?: A Little Help needed standing up from a chair using your arms (e.g., wheelchair or bedside chair)?: A Little Help needed to walk in hospital room?: A Little Help needed climbing 3-5 steps with a railing? : A Lot 6 Click Score: 17    End of Session Equipment Utilized During Treatment: Gait belt Activity Tolerance: Other (comment) (orthostatic) Patient left: with call bell/phone within reach;in bed;with bed alarm set Nurse Communication: Mobility status PT Visit Diagnosis: Muscle weakness (generalized) (M62.81);Difficulty in walking, not elsewhere classified (R26.2)     Time: 8003-4917 PT Time Calculation (min) (ACUTE ONLY): 49 min  Charges:  $Gait Training: 8-22 mins $Therapeutic Exercise: 8-22 mins $Therapeutic Activity: 8-22 mins                     Anise Salvo, PT Acute Rehab Services Pager [phone] [facility] [phone]    Rayetta Humphrey 06/02/2021, 3:16 PM

## 2021-06-02 NOTE — Care Management CC44 (Signed)
Condition Code 44 Documentation Completed  Patient Details  Name: Whitney Rogers MRN: [id number] Date of Birth: [birth year]   Condition Code 44 given:  Yes Patient signature on Condition Code 44 notice:  Yes Documentation of 2 MD's agreement:  Yes Code 44 added to claim:  Yes    Ida Rogue, LCSW 06/02/2021, 12:49 PM

## 2021-06-02 NOTE — Care Management Obs Status (Signed)
MEDICARE OBSERVATION STATUS NOTIFICATION   Patient Details  Name: Whitney Rogers MRN: [id number] Date of Birth: 1945-12-05   Medicare Observation Status Notification Given:  Yes    Ida Rogue, LCSW 06/02/2021, 12:49 PM

## 2021-06-03 DIAGNOSIS — M1711 Unilateral primary osteoarthritis, right knee: Secondary | ICD-10-CM | POA: Diagnosis not present

## 2021-06-03 MED ORDER — FERROUS SULFATE 325 (65 FE) MG PO TABS
325.0000 mg | ORAL_TABLET | Freq: Every day | ORAL | Status: DC
  Administered 2021-06-03: 325 mg via ORAL
  Filled 2021-06-03: qty 1

## 2021-06-03 NOTE — Progress Notes (Signed)
The patient is alert and oriented and has been seen by her physician. The orders for discharge were written. IV has been removed. Went over discharge instructions with patient and family. She is being discharged via wheelchair with all of her belongings.  

## 2021-06-03 NOTE — Plan of Care (Signed)

## 2021-06-03 NOTE — Progress Notes (Signed)
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 Hx of renal failure, osteopenia    PT Comments    Pt making good progress this afternoon.  BP's stable and pt's confidence in her abilities improved.  Ambulated 27' with RW and min guard.  Has good support and DME.  Continues to have good quad activation, pain control, and ROM.  Pt demonstrates safe gait & transfers in order to return home from PT perspective once discharged by MD.  While in hospital, will continue to benefit from PT for skilled therapy to advance mobility and exercises.      Recommendations for follow up therapy are one component of a multi-disciplinary discharge planning process, led by the attending physician.  Recommendations may be updated based on patient status, additional functional criteria and insurance authorization.  Follow Up Recommendations  Home health PT     Assistance Recommended at Discharge Frequent or constant Supervision/Assistance  Equipment Recommendations  Rolling walker (2 wheels)    Recommendations for Other Services       Precautions / Restrictions Precautions Precautions: Fall;Knee Precaution Comments: . Restrictions RLE Weight Bearing: Weight bearing as tolerated Other Position/Activity Restrictions: WBAT     Mobility  Bed Mobility               General bed mobility comments: in recliner    Transfers Overall transfer level: Needs assistance Equipment used: Rolling walker (2 wheels) Transfers: Sit to/from Stand Sit to Stand: Min guard           General transfer comment: Performed x 3 during session; min cues for hand placement; slow transfers to limit hypotension    Ambulation/Gait Ambulation/Gait assistance: Min guard Gait Distance (Feet): 90 Feet Assistive device: Rolling walker (2 wheels) Gait Pattern/deviations:  Step-to pattern;Decreased stride length Gait velocity: decreased   Pre-gait activities: Standing weight shifting and mini squats for total 5 mins before ambulation - to limit hypotension General Gait Details: Min cues for sequencing initially; no seated rest break but did take 3-4 standing breaks   Stairs             Wheelchair Mobility    Modified Rankin (Stroke Patients Only)       Balance Overall balance assessment: Needs assistance Sitting-balance support: No upper extremity supported Sitting balance-Leahy Scale: Good     Standing balance support: Bilateral upper extremity supported;Reliant on assistive device for balance Standing balance-Leahy Scale: Poor Standing balance comment: Using RW but stable RW                            Cognition Arousal/Alertness: Awake/alert Behavior During Therapy: Anxious Overall Cognitive Status: Within Functional Limits for tasks assessed                                 General Comments: spouse present        Exercises Total Joint Exercises Ankle Circles/Pumps: AROM;Both;10 reps;Seated Quad Sets: AROM;Both;10 reps;Seated Towel Squeeze: AROM;Both;10 reps;Seated Heel Slides: AROM;Both;10 reps;Seated Hip ABduction/ADduction: AROM;Both;10 reps;Seated Long Arc Quad: AROM;Both;10 reps;Seated Knee Flexion: AROM;Right;10 reps;Seated Goniometric ROM: R knee 5 to 80 Other Exercises Other Exercises: Pt also performed weight shifting and 10 mini squats in standing (pt wanting to do mini-squats, helped her feel better, loosen knee).  Performed UE  AROM in sitting before standing Other Exercises: Toelrated well, cued on correct form    General Comments General comments (skin integrity, edema, etc.): BP's stable 130's/60's      Pertinent Vitals/Pain Pain Assessment: 0-10 Pain Score: 4  Pain Location: R thigh Pain Descriptors / Indicators: Aching;Discomfort;Sore Pain Intervention(s): Limited activity  within patient's tolerance;Monitored during session;Premedicated before session    Home Living                          Prior Function            PT Goals (current goals can now be found in the care plan section) Progress towards PT goals: Progressing toward goals    Frequency    7X/week      PT Plan Current plan remains appropriate    Co-evaluation              AM-PAC PT "6 Clicks" Mobility   Outcome Measure  Help needed turning from your back to your side while in a flat bed without using bedrails?: A Little Help needed moving from lying on your back to sitting on the side of a flat bed without using bedrails?: A Little Help needed moving to and from a bed to a chair (including a wheelchair)?: A Little Help needed standing up from a chair using your arms (e.g., wheelchair or bedside chair)?: A Little Help needed to walk in hospital room?: A Little Help needed climbing 3-5 steps with a railing? : A Little 6 Click Score: 18    End of Session Equipment Utilized During Treatment: Gait belt Activity Tolerance: Patient tolerated treatment well Patient left: with call bell/phone within reach;with chair alarm set;in chair;with family/visitor present Nurse Communication: Mobility status PT Visit Diagnosis: Muscle weakness (generalized) (M62.81);Difficulty in walking, not elsewhere classified (R26.2)     Time: 1639-1700 PT Time Calculation (min) (ACUTE ONLY): 21 min  Charges:  Gait training x 1                    Vasilis Luhman, PT Acute Rehab Services Pager [phone] [facility] [phone]    Rayetta Humphrey 06/03/2021, 5:06 PM

## 2021-06-03 NOTE — Plan of Care (Signed)
Plan of care reviewed and discussed with the patient and her husband. 

## 2021-06-03 NOTE — Progress Notes (Signed)
Physical Therapy Treatment Patient Details Name: Whitney Rogers MRN: [id number] DOB: 11-21-1945 Today's Date: 06/03/2021   History of Present Illness Pt is 75 yo female s/p R TKA 06/01/21. Hx of renal failure, osteopenia    PT Comments    Pt with improved tolerance for therapy.  She was able to ambulate 30' x 2 with RW and stable BP.  Pt very anxious in regards to movement/BP.  Allowed extra time, AROM exercises, and standing pre-gait to help to limit orthostatic hypotension and ease pt's anxiety.  Pt would like to see how she tolerates another session this afternoon prior to d/c.   Recommendations for follow up therapy are one component of a multi-disciplinary discharge planning process, led by the attending physician.  Recommendations may be updated based on patient status, additional functional criteria and insurance authorization.  Follow Up Recommendations  Home health PT     Assistance Recommended at Discharge Frequent or constant Supervision/Assistance  Equipment Recommendations  Rolling walker (2 wheels)    Recommendations for Other Services       Precautions / Restrictions Precautions Precautions: Fall;Knee Precaution Comments: R knee buckling Restrictions Other Position/Activity Restrictions: WBAT     Mobility  Bed Mobility               General bed mobility comments: in recliner    Transfers Overall transfer level: Needs assistance Equipment used: Rolling walker (2 wheels) Transfers: Sit to/from Stand Sit to Stand: Min guard           General transfer comment: Performed x 3 during session; min cues for hand placement; slow transfers to limit hypotension    Ambulation/Gait Ambulation/Gait assistance: Min guard Gait Distance (Feet): 30 Feet (30'x2) Assistive device: Rolling walker (2 wheels) Gait Pattern/deviations: Step-to pattern;Decreased stride length Gait velocity: decreased   Pre-gait activities: Standing weight shifting and mini squats  for total 5 mins before ambulation - to limit hypotension General Gait Details: Min cues for RW and encouragement.  Cues for slow deep breaths , taking her time, and relaxation.  Had chair follow.  Ambulated 30'x2 with seated rest break, also took several standing rest breaks.  Pt's BP stable, still reports feeling flushed/hot at times but not diaphoretic and BP stable   Stairs             Wheelchair Mobility    Modified Rankin (Stroke Patients Only)       Balance Overall balance assessment: Needs assistance Sitting-balance support: No upper extremity supported Sitting balance-Leahy Scale: Good     Standing balance support: Bilateral upper extremity supported;Reliant on assistive device for balance Standing balance-Leahy Scale: Poor Standing balance comment: Using RW but stable RW                            Cognition Arousal/Alertness: Awake/alert Behavior During Therapy: Anxious Overall Cognitive Status: Within Functional Limits for tasks assessed                                 General Comments: spouse present        Exercises Total Joint Exercises Ankle Circles/Pumps: AROM;Both;10 reps;Seated Quad Sets: AROM;Both;10 reps;Seated Towel Squeeze: AROM;Both;10 reps;Seated Heel Slides: AROM;Both;10 reps;Seated Hip ABduction/ADduction: AROM;Both;10 reps;Seated Long Arc Quad: AROM;Both;10 reps;Seated Knee Flexion: AROM;Right;10 reps;Seated Goniometric ROM: R knee 5 to 80 Other Exercises Other Exercises: Pt also performed weight shifting and 10 mini squats in standing (  pt wanting to do mini-squats, helped her feel better, loosen knee).  Performed UE AROM in sitting before standing Other Exercises: Toelrated well, cued on correct form    General Comments General comments (skin integrity, edema, etc.): Provided increased time with all transfers with seated exercises and pre gait activities in order to limit orthostatic hypotension and ease pt's  anxiety.  BPs were consistently 170's / 65-77 with sitting, standing, post walk.      Pertinent Vitals/Pain Pain Assessment: 0-10 Pain Score: 4  Pain Location: R thigh Pain Descriptors / Indicators: Aching;Discomfort;Sore Pain Intervention(s): Limited activity within patient's tolerance;Monitored during session;Repositioned;Ice applied    Home Living                          Prior Function            PT Goals (current goals can now be found in the care plan section) Progress towards PT goals: Progressing toward goals    Frequency    7X/week      PT Plan Current plan remains appropriate    Co-evaluation              AM-PAC PT "6 Clicks" Mobility   Outcome Measure  Help needed turning from your back to your side while in a flat bed without using bedrails?: A Little Help needed moving from lying on your back to sitting on the side of a flat bed without using bedrails?: A Little Help needed moving to and from a bed to a chair (including a wheelchair)?: A Little Help needed standing up from a chair using your arms (e.g., wheelchair or bedside chair)?: A Little Help needed to walk in hospital room?: A Little Help needed climbing 3-5 steps with a railing? : A Little 6 Click Score: 18    End of Session Equipment Utilized During Treatment: Gait belt Activity Tolerance: Patient tolerated treatment well Patient left: with call bell/phone within reach;with chair alarm set;in chair;with family/visitor present Nurse Communication: Mobility status PT Visit Diagnosis: Muscle weakness (generalized) (M62.81);Difficulty in walking, not elsewhere classified (R26.2)     Time: 7530-0511 PT Time Calculation (min) (ACUTE ONLY): 48 min  Charges:  $Gait Training: 8-22 mins $Therapeutic Exercise: 8-22 mins $Therapeutic Activity: 8-22 mins                     Anise Salvo, PT Acute Rehab Services Pager [phone] [facility] [phone]    Rayetta Humphrey 06/03/2021, 1:17 PM

## 2021-06-03 NOTE — Discharge Summary (Signed)
Patient ID: Whitney Rogers MRN: [id number] DOB/AGE: [birth year] 75 y.o.  Admit date: 06/01/2021 Discharge date: 06/03/2021  Admission Diagnoses:  Principal Problem:   Primary osteoarthritis of right knee Active Problems:   S/P total knee arthroplasty, right   Discharge Diagnoses:  Same  Past Medical History:  Diagnosis Date   Anxiety    CRF (chronic renal failure)    Hypertension    Hypothyroidism    Osteopenia     Surgeries: Procedure(s): RIGHT TOTAL KNEE ARTHROPLASTY on 06/01/2021   Consultants:   Discharged Condition: Improved  Hospital Course: Karsynn ANGIE PIERCEY is an 75 y.o. female who was admitted 06/01/2021 for operative treatment ofPrimary osteoarthritis of right knee. Patient has severe unremitting pain that affects sleep, daily activities, and work/hobbies. After pre-op clearance the patient was taken to the operating room on 06/01/2021 and underwent  Procedure(s): RIGHT TOTAL KNEE ARTHROPLASTY.    Patient was given perioperative antibiotics:  Anti-infectives (From admission, onward)    Start     Dose/Rate Route Frequency Ordered Stop   06/01/21 1424  ceFAZolin (ANCEF) 2-4 GM/100ML-% IVPB       Note to Pharmacy: Melina Fiddler J: cabinet override      06/01/21 1424 06/01/21 1515   06/01/21 1330  ceFAZolin (ANCEF) IVPB 2g/100 mL premix        2 g 200 mL/hr over 30 Minutes Intravenous Every 6 hours 06/01/21 0941 06/01/21 2330   06/01/21 0600  ceFAZolin (ANCEF) IVPB 2g/100 mL premix        2 g 200 mL/hr over 30 Minutes Intravenous On call to O.R. 06/01/21 0530 06/01/21 0744        Patient was given sequential compression devices, early ambulation, and chemoprophylaxis to prevent DVT.  Patient benefited maximally from hospital stay and there were no complications.    Recent vital signs: Patient Vitals for the past 24 hrs:  BP Temp Temp src Pulse Resp SpO2  06/03/21 1321 (!) 162/73 98 F (36.7 C) -- 76 18 100 %  06/03/21 0554 (!) 144/64 98.1 F (36.7  C) Oral 85 17 94 %  06/02/21 2122 137/67 98.6 F (37 C) Oral 84 16 95 %     Recent laboratory studies:  Recent Labs    06/02/21 1503  WBC 13.5*  HGB 11.1*  HCT 33.9*  PLT 231  NA 133*  K 3.6  CL 100  CO2 27  BUN 20  CREATININE 1.04*  GLUCOSE 130*  CALCIUM 8.3*     Discharge Medications:   Allergies as of 06/03/2021       Reactions   Lisinopril Cough        Medication List     STOP taking these medications    celecoxib 200 MG capsule Commonly known as: CELEBREX       TAKE these medications    ALPRAZolam 0.5 MG tablet Commonly known as: XANAX Take 0.75 mg by mouth at bedtime.   Calcium 600-200 MG-UNIT tablet Take 1 tablet by mouth daily.   estradiol 0.05 mg/24hr patch Commonly known as: CLIMARA - Dosed in mg/24 hr Place 0.05 mg onto the skin once a week.   levothyroxine 75 MCG tablet Commonly known as: SYNTHROID Take 75 mcg by mouth daily before breakfast.   Moderna COVID-19 Bival Booster 50 MCG/0.5ML injection Generic drug: COVID-19 mRNA bivalent vaccine (Moderna) Inject into the muscle.   multivitamin tablet Take 1 tablet by mouth daily.   progesterone 100 MG capsule Commonly known as: PROMETRIUM Take 100 mg by mouth  daily.   solifenacin 5 MG tablet Commonly known as: VESICARE Take 5 mg by mouth daily.   traMADol 50 MG tablet Commonly known as: ULTRAM Take 50 mg by mouth at bedtime.   VITAMIN C PO Take 1 tablet by mouth daily.               Durable Medical Equipment  (From admission, onward)           Start     Ordered   06/01/21 1854  DME Walker rolling  Once       Question:  Patient needs a walker to treat with the following condition  Answer:  Primary osteoarthritis of right knee   06/01/21 1853   06/01/21 1854  DME 3 n 1  Once        06/01/21 1853   06/01/21 1854  DME Bedside commode  Once       Question:  Patient needs a bedside commode to treat with the following condition  Answer:  Primary osteoarthritis  of right knee   06/01/21 1853            Diagnostic Studies: No results found.  Disposition: Discharge disposition: 01-Home or Self Care       Discharge Instructions     Call MD / Call 911   Complete by: As directed    If you experience chest pain or shortness of breath, CALL 911 and be transported to the hospital emergency room.  If you develope a fever above 101 F, pus (white drainage) or increased drainage or redness at the wound, or calf pain, call your surgeon's office.   Call MD / Call 911   Complete by: As directed    If you experience chest pain or shortness of breath, CALL 911 and be transported to the hospital emergency room.  If you develope a fever above 101 F, pus (white drainage) or increased drainage or redness at the wound, or calf pain, call your surgeon's office.   Call MD / Call 911   Complete by: As directed    If you experience chest pain or shortness of breath, CALL 911 and be transported to the hospital emergency room.  If you develope a fever above 101 F, pus (white drainage) or increased drainage or redness at the wound, or calf pain, call your surgeon's office.   Constipation Prevention   Complete by: As directed    Drink plenty of fluids.  Prune juice may be helpful.  You may use a stool softener, such as Colace (over the counter) 100 mg twice a day.  Use MiraLax (over the counter) for constipation as needed.   Constipation Prevention   Complete by: As directed    Drink plenty of fluids.  Prune juice may be helpful.  You may use a stool softener, such as Colace (over the counter) 100 mg twice a day.  Use MiraLax (over the counter) for constipation as needed.   Constipation Prevention   Complete by: As directed    Drink plenty of fluids.  Prune juice may be helpful.  You may use a stool softener, such as Colace (over the counter) 100 mg twice a day.  Use MiraLax (over the counter) for constipation as needed.   Diet - low sodium heart healthy   Complete  by: As directed    Diet - low sodium heart healthy   Complete by: As directed    Diet - low sodium heart healthy   Complete by:  As directed    Discharge instructions   Complete by: As directed    INSTRUCTIONS AFTER JOINT REPLACEMENT   Remove items at home which could result in a fall. This includes throw rugs or furniture in walking pathways ICE to the affected joint every three hours while awake for 30 minutes at a time, for at least the first 3-5 days, and then as needed for pain and swelling.  Continue to use ice for pain and swelling. You may notice swelling that will progress down to the foot and ankle.  This is normal after surgery.  Elevate your leg when you are not up walking on it.   Continue to use the breathing machine you got in the hospital (incentive spirometer) which will help keep your temperature down.  It is common for your temperature to cycle up and down following surgery, especially at night when you are not up moving around and exerting yourself.  The breathing machine keeps your lungs expanded and your temperature down.   DIET:  As you were doing prior to hospitalization, we recommend a well-balanced diet.  DRESSING / WOUND CARE / SHOWERING  You may shower 3 days after surgery, but keep the wounds dry during showering.  You may use an occlusive plastic wrap (Press'n Seal for example), NO SOAKING/SUBMERGING IN THE BATHTUB.  If the bandage gets wet, change with a clean dry gauze.  If the incision gets wet, pat the wound dry with a clean towel.  ACTIVITY  Increase activity slowly as tolerated, but follow the weight bearing instructions below.   No driving for 6 weeks or until further direction given by your physician.  You cannot drive while taking narcotics.  No lifting or carrying greater than 10 lbs. until further directed by your surgeon. Avoid periods of inactivity such as sitting longer than an hour when not asleep. This helps prevent blood clots.  You may return  to work once you are authorized by your doctor.     WEIGHT BEARING   Weight bearing as tolerated with assist device (walker, cane, etc) as directed, use it as long as suggested by your surgeon or therapist, typically at least 4-6 weeks.   EXERCISES  Results after joint replacement surgery are often greatly improved when you follow the exercise, range of motion and muscle strengthening exercises prescribed by your doctor. Safety measures are also important to protect the joint from further injury. Any time any of these exercises cause you to have increased pain or swelling, decrease what you are doing until you are comfortable again and then slowly increase them. If you have problems or questions, call your caregiver or physical therapist for advice.   Rehabilitation is important following a joint replacement. After just a few days of immobilization, the muscles of the leg can become weakened and shrink (atrophy).  These exercises are designed to build up the tone and strength of the thigh and leg muscles and to improve motion. Often times heat used for twenty to thirty minutes before working out will loosen up your tissues and help with improving the range of motion but do not use heat for the first two weeks following surgery (sometimes heat can increase post-operative swelling).   These exercises can be done on a training (exercise) mat, on the floor, on a table or on a bed. Use whatever works the best and is most comfortable for you.    Use music or television while you are exercising so that the exercises are  a pleasant break in your day. This will make your life better with the exercises acting as a break in your routine that you can look forward to.   Perform all exercises about fifteen times, three times per day or as directed.  You should exercise both the operative leg and the other leg as well.  Exercises include:   Quad Sets - Tighten up the muscle on the front of the thigh (Quad) and  hold for 5-10 seconds.   Straight Leg Raises - With your knee straight (if you were given a brace, keep it on), lift the leg to 60 degrees, hold for 3 seconds, and slowly lower the leg.  Perform this exercise against resistance later as your leg gets stronger.  Leg Slides: Lying on your back, slowly slide your foot toward your buttocks, bending your knee up off the floor (only go as far as is comfortable). Then slowly slide your foot back down until your leg is flat on the floor again.  Angel Wings: Lying on your back spread your legs to the side as far apart as you can without causing discomfort.  Hamstring Strength:  Lying on your back, push your heel against the floor with your leg straight by tightening up the muscles of your buttocks.  Repeat, but this time bend your knee to a comfortable angle, and push your heel against the floor.  You may put a pillow under the heel to make it more comfortable if necessary.   A rehabilitation program following joint replacement surgery can speed recovery and prevent re-injury in the future due to weakened muscles. Contact your doctor or a physical therapist for more information on knee rehabilitation.    CONSTIPATION  Constipation is defined medically as fewer than three stools per week and severe constipation as less than one stool per week.  Even if you have a regular bowel pattern at home, your normal regimen is likely to be disrupted due to multiple reasons following surgery.  Combination of anesthesia, postoperative narcotics, change in appetite and fluid intake all can affect your bowels.   YOU MUST use at least one of the following options; they are listed in order of increasing strength to get the job done.  They are all available over the counter, and you may need to use some, POSSIBLY even all of these options:    Drink plenty of fluids (prune juice may be helpful) and high fiber foods Colace 100 mg by mouth twice a day  Senokot for constipation as  directed and as needed Dulcolax (bisacodyl), take with full glass of water  Miralax (polyethylene glycol) once or twice a day as needed.  If you have tried all these things and are unable to have a bowel movement in the first 3-4 days after surgery call either your surgeon or your primary doctor.    If you experience loose stools or diarrhea, hold the medications until you stool forms back up.  If your symptoms do not get better within 1 week or if they get worse, check with your doctor.  If you experience "the worst abdominal pain ever" or develop nausea or vomiting, please contact the office immediately for further recommendations for treatment.   ITCHING:  If you experience itching with your medications, try taking only a single pain pill, or even half a pain pill at a time.  You can also use Benadryl over the counter for itching or also to help with sleep.   TED HOSE STOCKINGS:  Use stockings on both legs until for at least 2 weeks or as directed by physician office. They may be removed at night for sleeping.  MEDICATIONS:  See your medication summary on the "After Visit Summary" that nursing will review with you.  You may have some home medications which will be placed on hold until you complete the course of blood thinner medication.  It is important for you to complete the blood thinner medication as prescribed.  PRECAUTIONS:  If you experience chest pain or shortness of breath - call 911 immediately for transfer to the hospital emergency department.   If you develop a fever greater that 101 F, purulent drainage from wound, increased redness or drainage from wound, foul odor from the wound/dressing, or calf pain - CONTACT YOUR SURGEON.                                                   FOLLOW-UP APPOINTMENTS:  If you do not already have a post-op appointment, please call the office for an appointment to be seen by your surgeon.  Guidelines for how soon to be seen are listed in your "After  Visit Summary", but are typically between 1-4 weeks after surgery.  OTHER INSTRUCTIONS:   Knee Replacement:  Do not place pillow under knee, focus on keeping the knee straight while resting. CPM instructions: 0-90 degrees, 2 hours in the morning, 2 hours in the afternoon, and 2 hours in the evening. Place foam block, curve side up under heel at all times except when in CPM or when walking.  DO NOT modify, tear, cut, or change the foam block in any way.  POST-OPERATIVE OPIOID TAPER INSTRUCTIONS: It is important to wean off of your opioid medication as soon as possible. If you do not need pain medication after your surgery it is ok to stop day one. Opioids include: Codeine, Hydrocodone(Norco, Vicodin), Oxycodone(Percocet, oxycontin) and hydromorphone amongst others.  Long term and even short term use of opiods can cause: Increased pain response Dependence Constipation Depression Respiratory depression And more.  Withdrawal symptoms can include Flu like symptoms Nausea, vomiting And more Techniques to manage these symptoms Hydrate well Eat regular healthy meals Stay active Use relaxation techniques(deep breathing, meditating, yoga) Do Not substitute Alcohol to help with tapering If you have been on opioids for less than two weeks and do not have pain than it is ok to stop all together.  Plan to wean off of opioids This plan should start within one week post op of your joint replacement. Maintain the same interval or time between taking each dose and first decrease the dose.  Cut the total daily intake of opioids by one tablet each day Next start to increase the time between doses. The last dose that should be eliminated is the evening dose.     MAKE SURE YOU:  Understand these instructions.  Get help right away if you are not doing well or get worse.    Thank you for letting us be a part of your medical care team.  It is a privilege we respect greatly.  We hope these  instructions will help you stay on track for a fast and full recovery!   Discharge instructions   Complete by: As directed    INSTRUCTIONS AFTER JOINT REPLACEMENT   Remove items at home which could result  in a fall. This includes throw rugs or furniture in walking pathways ICE to the affected joint every three hours while awake for 30 minutes at a time, for at least the first 3-5 days, and then as needed for pain and swelling.  Continue to use ice for pain and swelling. You may notice swelling that will progress down to the foot and ankle.  This is normal after surgery.  Elevate your leg when you are not up walking on it.   Continue to use the breathing machine you got in the hospital (incentive spirometer) which will help keep your temperature down.  It is common for your temperature to cycle up and down following surgery, especially at night when you are not up moving around and exerting yourself.  The breathing machine keeps your lungs expanded and your temperature down.   DIET:  As you were doing prior to hospitalization, we recommend a well-balanced diet.  DRESSING / WOUND CARE / SHOWERING  You may shower 3 days after surgery, but keep the wounds dry during showering.  You may use an occlusive plastic wrap (Press'n Seal for example), NO SOAKING/SUBMERGING IN THE BATHTUB.  If the bandage gets wet, change with a clean dry gauze.  If the incision gets wet, pat the wound dry with a clean towel.  ACTIVITY  Increase activity slowly as tolerated, but follow the weight bearing instructions below.   No driving for 6 weeks or until further direction given by your physician.  You cannot drive while taking narcotics.  No lifting or carrying greater than 10 lbs. until further directed by your surgeon. Avoid periods of inactivity such as sitting longer than an hour when not asleep. This helps prevent blood clots.  You may return to work once you are authorized by your doctor.     WEIGHT BEARING    Weight bearing as tolerated with assist device (walker, cane, etc) as directed, use it as long as suggested by your surgeon or therapist, typically at least 4-6 weeks.   EXERCISES  Results after joint replacement surgery are often greatly improved when you follow the exercise, range of motion and muscle strengthening exercises prescribed by your doctor. Safety measures are also important to protect the joint from further injury. Any time any of these exercises cause you to have increased pain or swelling, decrease what you are doing until you are comfortable again and then slowly increase them. If you have problems or questions, call your caregiver or physical therapist for advice.   Rehabilitation is important following a joint replacement. After just a few days of immobilization, the muscles of the leg can become weakened and shrink (atrophy).  These exercises are designed to build up the tone and strength of the thigh and leg muscles and to improve motion. Often times heat used for twenty to thirty minutes before working out will loosen up your tissues and help with improving the range of motion but do not use heat for the first two weeks following surgery (sometimes heat can increase post-operative swelling).   These exercises can be done on a training (exercise) mat, on the floor, on a table or on a bed. Use whatever works the best and is most comfortable for you.    Use music or television while you are exercising so that the exercises are a pleasant break in your day. This will make your life better with the exercises acting as a break in your routine that you can look forward to.  Perform all exercises about fifteen times, three times per day or as directed.  You should exercise both the operative leg and the other leg as well.  Exercises include:   Quad Sets - Tighten up the muscle on the front of the thigh (Quad) and hold for 5-10 seconds.   Straight Leg Raises - With your knee straight  (if you were given a brace, keep it on), lift the leg to 60 degrees, hold for 3 seconds, and slowly lower the leg.  Perform this exercise against resistance later as your leg gets stronger.  Leg Slides: Lying on your back, slowly slide your foot toward your buttocks, bending your knee up off the floor (only go as far as is comfortable). Then slowly slide your foot back down until your leg is flat on the floor again.  Angel Wings: Lying on your back spread your legs to the side as far apart as you can without causing discomfort.  Hamstring Strength:  Lying on your back, push your heel against the floor with your leg straight by tightening up the muscles of your buttocks.  Repeat, but this time bend your knee to a comfortable angle, and push your heel against the floor.  You may put a pillow under the heel to make it more comfortable if necessary.   A rehabilitation program following joint replacement surgery can speed recovery and prevent re-injury in the future due to weakened muscles. Contact your doctor or a physical therapist for more information on knee rehabilitation.    CONSTIPATION  Constipation is defined medically as fewer than three stools per week and severe constipation as less than one stool per week.  Even if you have a regular bowel pattern at home, your normal regimen is likely to be disrupted due to multiple reasons following surgery.  Combination of anesthesia, postoperative narcotics, change in appetite and fluid intake all can affect your bowels.   YOU MUST use at least one of the following options; they are listed in order of increasing strength to get the job done.  They are all available over the counter, and you may need to use some, POSSIBLY even all of these options:    Drink plenty of fluids (prune juice may be helpful) and high fiber foods Colace 100 mg by mouth twice a day  Senokot for constipation as directed and as needed Dulcolax (bisacodyl), take with full glass of  water  Miralax (polyethylene glycol) once or twice a day as needed.  If you have tried all these things and are unable to have a bowel movement in the first 3-4 days after surgery call either your surgeon or your primary doctor.    If you experience loose stools or diarrhea, hold the medications until you stool forms back up.  If your symptoms do not get better within 1 week or if they get worse, check with your doctor.  If you experience "the worst abdominal pain ever" or develop nausea or vomiting, please contact the office immediately for further recommendations for treatment.   ITCHING:  If you experience itching with your medications, try taking only a single pain pill, or even half a pain pill at a time.  You can also use Benadryl over the counter for itching or also to help with sleep.   TED HOSE STOCKINGS:  Use stockings on both legs until for at least 2 weeks or as directed by physician office. They may be removed at night for sleeping.  MEDICATIONS:  See  your medication summary on the "After Visit Summary" that nursing will review with you.  You may have some home medications which will be placed on hold until you complete the course of blood thinner medication.  It is important for you to complete the blood thinner medication as prescribed.  PRECAUTIONS:  If you experience chest pain or shortness of breath - call 911 immediately for transfer to the hospital emergency department.   If you develop a fever greater that 101 F, purulent drainage from wound, increased redness or drainage from wound, foul odor from the wound/dressing, or calf pain - CONTACT YOUR SURGEON.                                                   FOLLOW-UP APPOINTMENTS:  If you do not already have a post-op appointment, please call the office for an appointment to be seen by your surgeon.  Guidelines for how soon to be seen are listed in your "After Visit Summary", but are typically between 1-4 weeks after  surgery.  OTHER INSTRUCTIONS:   Knee Replacement:  Do not place pillow under knee, focus on keeping the knee straight while resting. CPM instructions: 0-90 degrees, 2 hours in the morning, 2 hours in the afternoon, and 2 hours in the evening. Place foam block, curve side up under heel at all times except when in CPM or when walking.  DO NOT modify, tear, cut, or change the foam block in any way.  POST-OPERATIVE OPIOID TAPER INSTRUCTIONS: It is important to wean off of your opioid medication as soon as possible. If you do not need pain medication after your surgery it is ok to stop day one. Opioids include: Codeine, Hydrocodone(Norco, Vicodin), Oxycodone(Percocet, oxycontin) and hydromorphone amongst others.  Long term and even short term use of opiods can cause: Increased pain response Dependence Constipation Depression Respiratory depression And more.  Withdrawal symptoms can include Flu like symptoms Nausea, vomiting And more Techniques to manage these symptoms Hydrate well Eat regular healthy meals Stay active Use relaxation techniques(deep breathing, meditating, yoga) Do Not substitute Alcohol to help with tapering If you have been on opioids for less than two weeks and do not have pain than it is ok to stop all together.  Plan to wean off of opioids This plan should start within one week post op of your joint replacement. Maintain the same interval or time between taking each dose and first decrease the dose.  Cut the total daily intake of opioids by one tablet each day Next start to increase the time between doses. The last dose that should be eliminated is the evening dose.     MAKE SURE YOU:  Understand these instructions.  Get help right away if you are not doing well or get worse.    Thank you for letting us be a part of your medical care team.  It is a privilege we respect greatly.  We hope these instructions will help you stay on track for a fast and full  recovery!   Discharge instructions   Complete by: As directed    INSTRUCTIONS AFTER JOINT REPLACEMENT   Remove items at home which could result in a fall. This includes throw rugs or furniture in walking pathways ICE to the affected joint every three hours while awake for 30 minutes at a time, for  at least the first 3-5 days, and then as needed for pain and swelling.  Continue to use ice for pain and swelling. You may notice swelling that will progress down to the foot and ankle.  This is normal after surgery.  Elevate your leg when you are not up walking on it.   Continue to use the breathing machine you got in the hospital (incentive spirometer) which will help keep your temperature down.  It is common for your temperature to cycle up and down following surgery, especially at night when you are not up moving around and exerting yourself.  The breathing machine keeps your lungs expanded and your temperature down.   DIET:  As you were doing prior to hospitalization, we recommend a well-balanced diet.  DRESSING / WOUND CARE / SHOWERING  You may shower 3 days after surgery, but keep the wounds dry during showering.  You may use an occlusive plastic wrap (Press'n Seal for example), NO SOAKING/SUBMERGING IN THE BATHTUB.  If the bandage gets wet, change with a clean dry gauze.  If the incision gets wet, pat the wound dry with a clean towel.  ACTIVITY  Increase activity slowly as tolerated, but follow the weight bearing instructions below.   No driving for 6 weeks or until further direction given by your physician.  You cannot drive while taking narcotics.  No lifting or carrying greater than 10 lbs. until further directed by your surgeon. Avoid periods of inactivity such as sitting longer than an hour when not asleep. This helps prevent blood clots.  You may return to work once you are authorized by your doctor.     WEIGHT BEARING   Weight bearing as tolerated with assist device (walker, cane,  etc) as directed, use it as long as suggested by your surgeon or therapist, typically at least 4-6 weeks.   EXERCISES  Results after joint replacement surgery are often greatly improved when you follow the exercise, range of motion and muscle strengthening exercises prescribed by your doctor. Safety measures are also important to protect the joint from further injury. Any time any of these exercises cause you to have increased pain or swelling, decrease what you are doing until you are comfortable again and then slowly increase them. If you have problems or questions, call your caregiver or physical therapist for advice.   Rehabilitation is important following a joint replacement. After just a few days of immobilization, the muscles of the leg can become weakened and shrink (atrophy).  These exercises are designed to build up the tone and strength of the thigh and leg muscles and to improve motion. Often times heat used for twenty to thirty minutes before working out will loosen up your tissues and help with improving the range of motion but do not use heat for the first two weeks following surgery (sometimes heat can increase post-operative swelling).   These exercises can be done on a training (exercise) mat, on the floor, on a table or on a bed. Use whatever works the best and is most comfortable for you.    Use music or television while you are exercising so that the exercises are a pleasant break in your day. This will make your life better with the exercises acting as a break in your routine that you can look forward to.   Perform all exercises about fifteen times, three times per day or as directed.  You should exercise both the operative leg and the other leg as well.  Exercises include:   Quad Sets - Tighten up the muscle on the front of the thigh (Quad) and hold for 5-10 seconds.   Straight Leg Raises - With your knee straight (if you were given a brace, keep it on), lift the leg to 60  degrees, hold for 3 seconds, and slowly lower the leg.  Perform this exercise against resistance later as your leg gets stronger.  Leg Slides: Lying on your back, slowly slide your foot toward your buttocks, bending your knee up off the floor (only go as far as is comfortable). Then slowly slide your foot back down until your leg is flat on the floor again.  Angel Wings: Lying on your back spread your legs to the side as far apart as you can without causing discomfort.  Hamstring Strength:  Lying on your back, push your heel against the floor with your leg straight by tightening up the muscles of your buttocks.  Repeat, but this time bend your knee to a comfortable angle, and push your heel against the floor.  You may put a pillow under the heel to make it more comfortable if necessary.   A rehabilitation program following joint replacement surgery can speed recovery and prevent re-injury in the future due to weakened muscles. Contact your doctor or a physical therapist for more information on knee rehabilitation.    CONSTIPATION  Constipation is defined medically as fewer than three stools per week and severe constipation as less than one stool per week.  Even if you have a regular bowel pattern at home, your normal regimen is likely to be disrupted due to multiple reasons following surgery.  Combination of anesthesia, postoperative narcotics, change in appetite and fluid intake all can affect your bowels.   YOU MUST use at least one of the following options; they are listed in order of increasing strength to get the job done.  They are all available over the counter, and you may need to use some, POSSIBLY even all of these options:    Drink plenty of fluids (prune juice may be helpful) and high fiber foods Colace 100 mg by mouth twice a day  Senokot for constipation as directed and as needed Dulcolax (bisacodyl), take with full glass of water  Miralax (polyethylene glycol) once or twice a day as  needed.  If you have tried all these things and are unable to have a bowel movement in the first 3-4 days after surgery call either your surgeon or your primary doctor.    If you experience loose stools or diarrhea, hold the medications until you stool forms back up.  If your symptoms do not get better within 1 week or if they get worse, check with your doctor.  If you experience "the worst abdominal pain ever" or develop nausea or vomiting, please contact the office immediately for further recommendations for treatment.   ITCHING:  If you experience itching with your medications, try taking only a single pain pill, or even half a pain pill at a time.  You can also use Benadryl over the counter for itching or also to help with sleep.   TED HOSE STOCKINGS:  Use stockings on both legs until for at least 2 weeks or as directed by physician office. They may be removed at night for sleeping.  MEDICATIONS:  See your medication summary on the "After Visit Summary" that nursing will review with you.  You may have some home medications which will be placed on hold until  you complete the course of blood thinner medication.  It is important for you to complete the blood thinner medication as prescribed.  PRECAUTIONS:  If you experience chest pain or shortness of breath - call 911 immediately for transfer to the hospital emergency department.   If you develop a fever greater that 101 F, purulent drainage from wound, increased redness or drainage from wound, foul odor from the wound/dressing, or calf pain - CONTACT YOUR SURGEON.                                                   FOLLOW-UP APPOINTMENTS:  If you do not already have a post-op appointment, please call the office for an appointment to be seen by your surgeon.  Guidelines for how soon to be seen are listed in your "After Visit Summary", but are typically between 1-4 weeks after surgery.  OTHER INSTRUCTIONS:   Knee Replacement:  Do not place pillow  under knee, focus on keeping the knee straight while resting. CPM instructions: 0-90 degrees, 2 hours in the morning, 2 hours in the afternoon, and 2 hours in the evening. Place foam block, curve side up under heel at all times except when in CPM or when walking.  DO NOT modify, tear, cut, or change the foam block in any way.  POST-OPERATIVE OPIOID TAPER INSTRUCTIONS: It is important to wean off of your opioid medication as soon as possible. If you do not need pain medication after your surgery it is ok to stop day one. Opioids include: Codeine, Hydrocodone(Norco, Vicodin), Oxycodone(Percocet, oxycontin) and hydromorphone amongst others.  Long term and even short term use of opiods can cause: Increased pain response Dependence Constipation Depression Respiratory depression And more.  Withdrawal symptoms can include Flu like symptoms Nausea, vomiting And more Techniques to manage these symptoms Hydrate well Eat regular healthy meals Stay active Use relaxation techniques(deep breathing, meditating, yoga) Do Not substitute Alcohol to help with tapering If you have been on opioids for less than two weeks and do not have pain than it is ok to stop all together.  Plan to wean off of opioids This plan should start within one week post op of your joint replacement. Maintain the same interval or time between taking each dose and first decrease the dose.  Cut the total daily intake of opioids by one tablet each day Next start to increase the time between doses. The last dose that should be eliminated is the evening dose.     MAKE SURE YOU:  Understand these instructions.  Get help right away if you are not doing well or get worse.    Thank you for letting us be a part of your medical care team.  It is a privilege we respect greatly.  We hope these instructions will help you stay on track for a fast and full recovery!   Increase activity slowly as tolerated   Complete by: As directed     Increase activity slowly as tolerated   Complete by: As directed    Increase activity slowly as tolerated   Complete by: As directed    Post-operative opioid taper instructions:   Complete by: As directed    POST-OPERATIVE OPIOID TAPER INSTRUCTIONS: It is important to wean off of your opioid medication as soon as possible. If you do not need pain medication after  your surgery it is ok to stop day one. Opioids include: Codeine, Hydrocodone(Norco, Vicodin), Oxycodone(Percocet, oxycontin) and hydromorphone amongst others.  Long term and even short term use of opiods can cause: Increased pain response Dependence Constipation Depression Respiratory depression And more.  Withdrawal symptoms can include Flu like symptoms Nausea, vomiting And more Techniques to manage these symptoms Hydrate well Eat regular healthy meals Stay active Use relaxation techniques(deep breathing, meditating, yoga) Do Not substitute Alcohol to help with tapering If you have been on opioids for less than two weeks and do not have pain than it is ok to stop all together.  Plan to wean off of opioids This plan should start within one week post op of your joint replacement. Maintain the same interval or time between taking each dose and first decrease the dose.  Cut the total daily intake of opioids by one tablet each day Next start to increase the time between doses. The last dose that should be eliminated is the evening dose.      Post-operative opioid taper instructions:   Complete by: As directed    POST-OPERATIVE OPIOID TAPER INSTRUCTIONS: It is important to wean off of your opioid medication as soon as possible. If you do not need pain medication after your surgery it is ok to stop day one. Opioids include: Codeine, Hydrocodone(Norco, Vicodin), Oxycodone(Percocet, oxycontin) and hydromorphone amongst others.  Long term and even short term use of opiods can cause: Increased pain  response Dependence Constipation Depression Respiratory depression And more.  Withdrawal symptoms can include Flu like symptoms Nausea, vomiting And more Techniques to manage these symptoms Hydrate well Eat regular healthy meals Stay active Use relaxation techniques(deep breathing, meditating, yoga) Do Not substitute Alcohol to help with tapering If you have been on opioids for less than two weeks and do not have pain than it is ok to stop all together.  Plan to wean off of opioids This plan should start within one week post op of your joint replacement. Maintain the same interval or time between taking each dose and first decrease the dose.  Cut the total daily intake of opioids by one tablet each day Next start to increase the time between doses. The last dose that should be eliminated is the evening dose.      Post-operative opioid taper instructions:   Complete by: As directed    POST-OPERATIVE OPIOID TAPER INSTRUCTIONS: It is important to wean off of your opioid medication as soon as possible. If you do not need pain medication after your surgery it is ok to stop day one. Opioids include: Codeine, Hydrocodone(Norco, Vicodin), Oxycodone(Percocet, oxycontin) and hydromorphone amongst others.  Long term and even short term use of opiods can cause: Increased pain response Dependence Constipation Depression Respiratory depression And more.  Withdrawal symptoms can include Flu like symptoms Nausea, vomiting And more Techniques to manage these symptoms Hydrate well Eat regular healthy meals Stay active Use relaxation techniques(deep breathing, meditating, yoga) Do Not substitute Alcohol to help with tapering If you have been on opioids for less than two weeks and do not have pain than it is ok to stop all together.  Plan to wean off of opioids This plan should start within one week post op of your joint replacement. Maintain the same interval or time between taking  each dose and first decrease the dose.  Cut the total daily intake of opioids by one tablet each day Next start to increase the time between doses. The last dose that should  be eliminated is the evening dose.           Follow-up Information     Marcene Corning, MD. Schedule an appointment as soon as possible for a visit in 2 week(s).   Specialty: Orthopedic Surgery Contact information: [address] [place] [address] [phone]                  Signed: Ginger Organ Emiline Mancebo 06/03/2021, 4:04 PM

## 2021-06-03 NOTE — Progress Notes (Signed)
Subjective: 2 Days Post-Op Procedure(s) (LRB): RIGHT TOTAL KNEE ARTHROPLASTY (Right)  Patient feels better this morning. She slept better last night and feels a little better this morning. We did a CBC and BMP all which look good. She is hoping to go home today or tomorrow if her BP remains stable and she moves better with PT.  Activity level:  wbat Diet tolerance:  ok Voiding:  ok Patient reports pain as mild.    Objective: Vital signs in last 24 hours: Temp:  [98.1 F (36.7 C)-98.6 F (37 C)] 98.1 F (36.7 C) (12/15 0554) Pulse Rate:  [84-93] 85 (12/15 0554) Resp:  [16-17] 17 (12/15 0554) BP: (137-144)/(55-67) 144/64 (12/15 0554) SpO2:  [94 %-100 %] 94 % (12/15 0554)  Labs: Recent Labs    06/02/21 1503  HGB 11.1*   Recent Labs    06/02/21 1503  WBC 13.5*  RBC 3.49*  HCT 33.9*  PLT 231   Recent Labs    06/02/21 1503  NA 133*  K 3.6  CL 100  CO2 27  BUN 20  CREATININE 1.04*  GLUCOSE 130*  CALCIUM 8.3*   No results for input(s): LABPT, INR in the last 72 hours.  Physical Exam:  Neurologically intact ABD soft Neurovascular intact Sensation intact distally Intact pulses distally Dorsiflexion/Plantar flexion intact Incision: dressing C/D/I and no drainage No cellulitis present Compartment soft  Assessment/Plan:  2 Days Post-Op Procedure(s) (LRB): RIGHT TOTAL KNEE ARTHROPLASTY (Right) Advance diet Up with therapy I will check mid day to see how she does with PT. IF she does well and is feeling well then she could go home this afternoon. We will keep her fluids running. She has been up to bedside commode and has not become light headed since yesterday. I am stopping muscle relaxer and we will try to cut back on pain meds. Hgb is 11.1 but patient would like oral iron so I ordered this for her.  I will continue to follow closely.   Ginger Organ Ariba Lehnen 06/03/2021, 6:45 AM

## 2021-11-18 ENCOUNTER — Other Ambulatory Visit (HOSPITAL_BASED_OUTPATIENT_CLINIC_OR_DEPARTMENT_OTHER): Payer: Self-pay

## 2022-08-06 IMAGING — DX DG CHEST 1V PORT
1 series · 1 of 1 positions shown · non-contrast
Comparison: None.

CLINICAL DATA: Near syncope.

EXAM:
PORTABLE CHEST 1 VIEW

[chest ap]
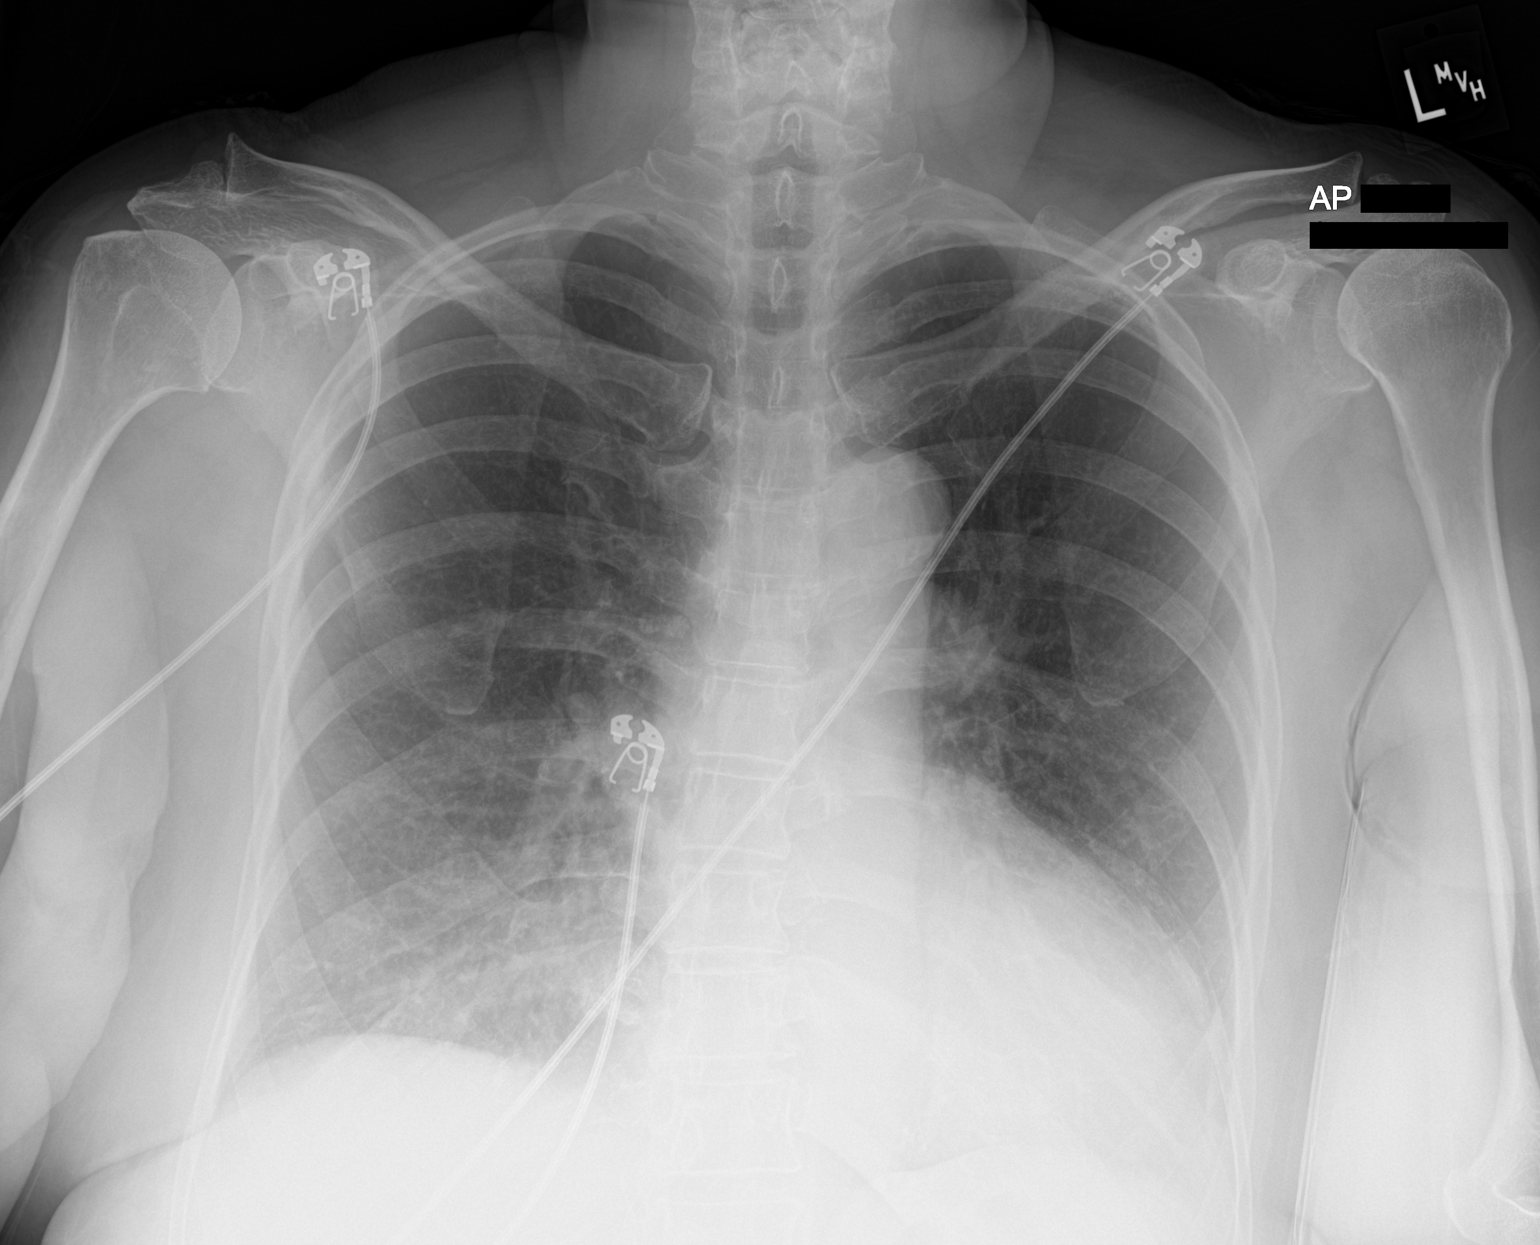

[1 of 1 positions shown; findings below may reference images not displayed]

FINDINGS: The heart size and mediastinal contours are within normal limits.
Left greater than right basilar opacities. No visible pleural
effusions or pneumothorax.
IMPRESSION: Left greater than right basilar opacities, which could represent
atelectasis, aspiration, and/or pneumonia.

## 2023-01-26 ENCOUNTER — Other Ambulatory Visit (HOSPITAL_COMMUNITY): Payer: Self-pay | Admitting: Internal Medicine

## 2023-01-26 DIAGNOSIS — I700 Atherosclerosis of aorta: Secondary | ICD-10-CM

## 2023-01-31 ENCOUNTER — Ambulatory Visit (HOSPITAL_COMMUNITY)
Admission: RE | Admit: 2023-01-31 | Discharge: 2023-01-31 | Disposition: A | Discharge status: Home / Self Care | Payer: BC Managed Care – PPO | Source: Ambulatory Visit | Attending: Internal Medicine | Admitting: Internal Medicine

## 2023-01-31 ENCOUNTER — Encounter (HOSPITAL_COMMUNITY): Payer: Self-pay

## 2023-01-31 DIAGNOSIS — I700 Atherosclerosis of aorta: Secondary | ICD-10-CM | POA: Insufficient documentation
